# Patient Record
Sex: Female | Born: 1985 | Race: White | Hispanic: No | Marital: Married | Smoking: Never smoker
Health system: Southern US, Community
[De-identification: ages and names within clinical notes are randomized; demographics above are authoritative.]

## PROBLEM LIST (undated history)

## (undated) DIAGNOSIS — A6000 Herpesviral infection of urogenital system, unspecified: Secondary | ICD-10-CM

## (undated) DIAGNOSIS — E039 Hypothyroidism, unspecified: Secondary | ICD-10-CM

## (undated) HISTORY — PX: MYOMECTOMY: SHX85

---

## 2006-01-29 ENCOUNTER — Observation Stay: Payer: Self-pay | Admitting: Obstetrics and Gynecology

## 2006-01-31 ENCOUNTER — Observation Stay: Payer: Self-pay

## 2006-02-03 ENCOUNTER — Observation Stay: Payer: Self-pay | Admitting: Obstetrics and Gynecology

## 2006-05-04 ENCOUNTER — Inpatient Hospital Stay: Payer: Self-pay | Admitting: Obstetrics and Gynecology

## 2008-10-20 ENCOUNTER — Inpatient Hospital Stay: Payer: Self-pay | Admitting: Obstetrics and Gynecology

## 2008-10-25 ENCOUNTER — Emergency Department: Payer: Self-pay | Admitting: Internal Medicine

## 2012-03-05 ENCOUNTER — Ambulatory Visit: Payer: Self-pay

## 2012-03-20 ENCOUNTER — Ambulatory Visit: Payer: Self-pay

## 2012-11-26 DIAGNOSIS — D219 Benign neoplasm of connective and other soft tissue, unspecified: Secondary | ICD-10-CM | POA: Insufficient documentation

## 2014-05-19 DIAGNOSIS — N972 Female infertility of uterine origin: Secondary | ICD-10-CM | POA: Insufficient documentation

## 2014-05-19 DIAGNOSIS — N978 Female infertility of other origin: Secondary | ICD-10-CM | POA: Insufficient documentation

## 2014-07-15 DIAGNOSIS — E282 Polycystic ovarian syndrome: Secondary | ICD-10-CM | POA: Insufficient documentation

## 2015-01-29 DIAGNOSIS — N949 Unspecified condition associated with female genital organs and menstrual cycle: Secondary | ICD-10-CM | POA: Insufficient documentation

## 2015-02-17 DIAGNOSIS — R42 Dizziness and giddiness: Secondary | ICD-10-CM | POA: Insufficient documentation

## 2016-04-26 ENCOUNTER — Other Ambulatory Visit: Payer: Self-pay | Admitting: Obstetrics and Gynecology

## 2016-04-26 DIAGNOSIS — R1900 Intra-abdominal and pelvic swelling, mass and lump, unspecified site: Secondary | ICD-10-CM

## 2016-05-02 ENCOUNTER — Ambulatory Visit: Payer: Self-pay

## 2016-11-25 ENCOUNTER — Encounter: Payer: Self-pay | Admitting: Emergency Medicine

## 2016-11-25 ENCOUNTER — Emergency Department: Payer: BC Managed Care – PPO

## 2016-11-25 ENCOUNTER — Emergency Department
Admission: EM | Admit: 2016-11-25 | Discharge: 2016-11-26 | Disposition: A | Discharge status: Home / Self Care | Payer: BC Managed Care – PPO | Attending: Emergency Medicine | Admitting: Emergency Medicine

## 2016-11-25 DIAGNOSIS — X509XXA Other and unspecified overexertion or strenuous movements or postures, initial encounter: Secondary | ICD-10-CM | POA: Diagnosis not present

## 2016-11-25 DIAGNOSIS — Y9364 Activity, baseball: Secondary | ICD-10-CM | POA: Insufficient documentation

## 2016-11-25 DIAGNOSIS — Y92320 Baseball field as the place of occurrence of the external cause: Secondary | ICD-10-CM | POA: Diagnosis not present

## 2016-11-25 DIAGNOSIS — S8992XA Unspecified injury of left lower leg, initial encounter: Secondary | ICD-10-CM | POA: Diagnosis present

## 2016-11-25 DIAGNOSIS — S82102A Unspecified fracture of upper end of left tibia, initial encounter for closed fracture: Secondary | ICD-10-CM | POA: Diagnosis not present

## 2016-11-25 DIAGNOSIS — Y998 Other external cause status: Secondary | ICD-10-CM | POA: Diagnosis not present

## 2016-11-25 MED ORDER — FENTANYL CITRATE (PF) 100 MCG/2ML IJ SOLN
50.0000 ug | Freq: Once | INTRAMUSCULAR | Status: AC
  Administered 2016-11-25: 50 ug via INTRAVENOUS
  Filled 2016-11-25: qty 2

## 2016-11-25 NOTE — ED Triage Notes (Signed)
Patient brought in by ems. Patient was playing softball and when she stepped on first base she hyperextended her left knee. Patient with pain and swelling to left knee.

## 2016-11-25 NOTE — ED Notes (Signed)
Pt with pain to anterior upper tibial area, left sided.. Cms intact to all toes. Pt states she does have "a little tingling" to left great toe. No obvious deformity noted.

## 2016-11-25 NOTE — ED Notes (Signed)
Pt states "i feel faint". Head of bed lowered to flat. Pt reports improvement in presyncopal symptoms.

## 2016-11-26 MED ORDER — OXYCODONE-ACETAMINOPHEN 5-325 MG PO TABS
2.0000 | ORAL_TABLET | Freq: Once | ORAL | Status: AC
  Administered 2016-11-26: 2 via ORAL
  Filled 2016-11-26: qty 2

## 2016-11-26 MED ORDER — ONDANSETRON 4 MG PO TBDP: 4.0000 mg | ORAL_TABLET | Freq: Three times a day (TID) | ORAL | 0 refills | Status: DC | PRN

## 2016-11-26 MED ORDER — OXYCODONE-ACETAMINOPHEN 5-325 MG PO TABS: 1.0000 | ORAL_TABLET | Freq: Four times a day (QID) | ORAL | 0 refills | Status: DC | PRN

## 2016-11-26 NOTE — ED Provider Notes (Signed)
[facility] Emergency Department Provider Note  Time seen: 12:06 AM  I have reviewed the triage vital signs and the nursing notes.   HISTORY  Chief Complaint Knee Pain    HPI Andrea Brewer is a 31 y.o. female with no past medical history who presents to the emergency department with left knee pain. According to the patient she was playing softball, she stepped on first base and felt pain in the leg as if her knee and hyperextended. She immediately fell to the ground tumbling. States she has not been able to stand on the knee since due to pain. Denies any other injuries. Describes her pain as mild currently after pain medication however was moderate to severe earlier.  History reviewed. No pertinent past medical history.  There are no active problems to display for this patient.   Past Surgical History:  Procedure Laterality Date  . MYOMECTOMY      Prior to Admission medications   Not on File    No Known Allergies  No family history on file.  Social History Social History  Substance Use Topics  . Smoking status: Never Smoker  . Smokeless tobacco: Never Used  . Alcohol use No    Review of Systems Constitutional: Negative for Loss of consciousness. Cardiovascular: Negative for chest pain. Respiratory: Negative for shortness of breath. Gastrointestinal: Negative for abdominal pain Musculoskeletal: Moderate left knee pain Skin: Negative for rash. Neurological: Negative for headache All other ROS negative  ____________________________________________   PHYSICAL EXAM:  VITAL SIGNS: ED Triage Vitals  Enc Vitals Group     BP 11/25/16 2154 (!) 117/101     Pulse Rate 11/25/16 2154 (!) 107     Resp 11/25/16 2154 18     Temp 11/25/16 2154 98.4 F (36.9 C)     Temp Source 11/25/16 2154 Oral     SpO2 11/25/16 2154 100 %     Weight 11/25/16 2154 205 lb (93 kg)     Height 11/25/16 2154 5\' 5"  (1.651 m)     Head Circumference --       Peak Flow --      Pain Score 11/25/16 2153 7     Pain Loc --      Pain Edu? --      Excl. in [place]? --     Constitutional: Alert and oriented. Well appearing and in no distress. Eyes: Normal exam ENT   Head: Normocephalic and atraumatic.   Mouth/Throat: Mucous membranes are moist. Cardiovascular: Normal rate, regular rhythm. No murmur Respiratory: Normal respiratory effort without tachypnea nor retractions. Breath sounds are clear Gastrointestinal: Soft and nontender. No distention.  Musculoskeletal: Good range of motion in all extremities. Patient has moderate tenderness to palpation of the left knee/upper proximal tibia no obvious swelling or effusion. Neurovascularly intact distally. The remainder of the tibia and fibula are nontender. Neurologic:  Normal speech and language. No gross focal neurologic deficits  Skin:  Skin is warm, dry and intact.  Psychiatric: Mood and affect are normal.   ____________________________________________     RADIOLOGY  X-ray shows possible fracture/avulsion of the medial tibia.  ____________________________________________   INITIAL IMPRESSION / ASSESSMENT AND PLAN / ED COURSE  Pertinent labs & imaging results that were available during my care of the patient were reviewed by me and considered in my medical decision making (see chart for details).  Patient presents with left knee pain after traumatic injury playing softball. Patient has medial proximal tibia tenderness otherwise nontender leg.  No distal tibia or fibula tenderness. On x-ray the patient appears to have a fracture versus small avulsion fracture of the medial left proximal tibia. I discussed this with Dr. Mack Guise who recommends a knee immobilizer and office follow-up. Patient states her pain is minimal after pain medication. We'll discharge with pain medicine, knee immobilizer crutches and nonweightbearing status. Patient will follow up with orthopedics next  week.  ____________________________________________   FINAL CLINICAL IMPRESSION(S) / ED DIAGNOSES  Proximal tibia fracture    Harvest Dark, MD 11/26/16 0010

## 2016-11-26 NOTE — Discharge Instructions (Signed)
Please call the number provided for orthopedics to arrange a follow-up appointment this coming week for recheck/reevaluation. Please wear your knee immobilizer, use crutches. You're not to bear any weight on the left leg, until orthopedics can evaluate. Return to the emergency department for any significant worsening of pain.

## 2016-11-26 NOTE — ED Notes (Signed)
Pt. Going home with husband. 

## 2016-11-28 ENCOUNTER — Other Ambulatory Visit: Payer: Self-pay | Admitting: Orthopedic Surgery

## 2016-11-28 DIAGNOSIS — S83259A Bucket-handle tear of lateral meniscus, current injury, unspecified knee, initial encounter: Secondary | ICD-10-CM

## 2016-11-29 ENCOUNTER — Ambulatory Visit
Admission: RE | Admit: 2016-11-29 | Discharge: 2016-11-29 | Disposition: A | Discharge status: Home / Self Care | Payer: BC Managed Care – PPO | Source: Ambulatory Visit | Attending: Orthopedic Surgery | Admitting: Orthopedic Surgery

## 2016-11-29 DIAGNOSIS — S8012XA Contusion of left lower leg, initial encounter: Secondary | ICD-10-CM | POA: Insufficient documentation

## 2016-11-29 DIAGNOSIS — S83212A Bucket-handle tear of medial meniscus, current injury, left knee, initial encounter: Secondary | ICD-10-CM | POA: Diagnosis not present

## 2016-11-29 DIAGNOSIS — X58XXXA Exposure to other specified factors, initial encounter: Secondary | ICD-10-CM | POA: Insufficient documentation

## 2016-11-29 DIAGNOSIS — S82142A Displaced bicondylar fracture of left tibia, initial encounter for closed fracture: Secondary | ICD-10-CM | POA: Diagnosis not present

## 2016-11-29 DIAGNOSIS — S83259A Bucket-handle tear of lateral meniscus, current injury, unspecified knee, initial encounter: Secondary | ICD-10-CM

## 2016-11-29 DIAGNOSIS — S82202A Unspecified fracture of shaft of left tibia, initial encounter for closed fracture: Secondary | ICD-10-CM | POA: Diagnosis not present

## 2017-01-12 ENCOUNTER — Ambulatory Visit: Payer: BC Managed Care – PPO | Attending: Orthopedic Surgery | Admitting: Physical Therapy

## 2017-01-12 DIAGNOSIS — R262 Difficulty in walking, not elsewhere classified: Secondary | ICD-10-CM | POA: Diagnosis present

## 2017-01-12 DIAGNOSIS — M25562 Pain in left knee: Secondary | ICD-10-CM | POA: Diagnosis present

## 2017-01-16 NOTE — Therapy (Signed)
[place] [facility] [address], [place], [address] Phone: [phone]   Fax:  [phone]  Physical Therapy Evaluation  Patient Details  Name: Andrea Brewer MRN: [id number] Date of Birth: 05-Sep-1985 No Data Recorded  Encounter Date: 01/12/2017      PT End of Session - 01/16/17 0754    Visit Number 1   Number of Visits 13   Date for PT Re-Evaluation 03/13/17   PT Start Time 7681   PT Stop Time 1830   PT Time Calculation (min) 55 min   Activity Tolerance Patient tolerated treatment well   Behavior During Therapy [facility] for tasks assessed/performed      No past medical history on file.  Past Surgical History:  Procedure Laterality Date  . MYOMECTOMY      There were no vitals filed for this visit.       Subjective Assessment - 01/16/17 0755    Subjective Patient reports she was running to 1st base and hyper-extended her L knee and sustained a non-displaced tibial plateau fracture. She was non weight bearing for 4 weeks, repeat X-ray showed bone healing and she was allowed progressive WBing with crutches. She still gets pain primarily with knee flexion, can also get with knee extension. She reports she has had her L LE buckle a few times walking.    Limitations Walking;Lifting;Standing;House hold activities   Diagnostic tests X-ray, follow up X-ray, CT. Indicative of medial plateau tibial fx (non-displaced) which has healed on follow up x-ray.    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    Currently in Pain? Other (Comment)  At rest very little pain, with demand on quadriceps like SLR or excessive ambulation she gets pain in the patellar tendon area      R knee ROM -8 - 128 degrees  L Knee ROM  -3 - 103 degrees   Quad lag noted on L SLR   Gait assessment with crutches - WNL appropriate heel strike on RLE (mild knee flexion on LLE and more plantar surface of L heel)  With  Crutch in RUE only, noted to have lateral trunk flexion to R side, Trendelenburg on L side.   Circumfrence measurements at joint line  R-40.0 cm L 43.5 cm   TherEx Provided  Quad sets with towel roll underneath her ankle emphasizing full extension ROM x 10 for 2 sets  Mini squats with HHA x 8 (going through roughly 30 degrees of knee flexion --no increased pain reported)  Knee flexion with towel for overpressure throughout the day in supine to increase L knee flexion ROM  Standing hip abduction with yellow/red band SLRs with knee immobilizer on until quad set is not observed x 10           Objective measurements completed on examination: See above findings.                  PT Education - 01/16/17 0755    Education provided Yes   Education Details Provided HEP and goal of increasing extension ROM and quadriceps strength to improve tolerance for ADLs.    Person(s) Educated Patient   Methods Explanation;Demonstration;Handout;Verbal cues   Comprehension Verbalized understanding;Returned demonstration;Verbal cues required             PT Long Term Goals - 01/16/17 0848      PT LONG TERM GOAL #1   Title Patient will demonstrate at least  120 degrees of knee flexion ROM to return to completing ADLs.   Baseline 103 degrees   Time 8   Period Weeks   Status New   Target Date 03/13/17     PT LONG TERM GOAL #2   Title Patient will demonstrate normalized gait pattern with no increase in pain to return to completing ADLs.    Baseline Painful gait with ADs.    Time 8   Period Weeks   Status New   Target Date 03/13/17     PT LONG TERM GOAL #3   Title Patient will jog for at least 5 minutes with no increase in pain to return to recreational activities.    Time 8   Period Weeks   Status New   Target Date 03/13/17     PT LONG TERM GOAL #4   Title Patient will perform full squat to lift up items at home with no increase in pain to return to performing ADLs.     Time 8   Period Weeks   Status New   Target Date 03/13/17                Plan - 01/16/17 0757    Clinical Impression Statement Patient presents roughly 2 months s/p non-displaced tibial plateau fx, imaging thus far has shown appropriate healing and she is allowed to WB as tolerated. She demonstrates quadriceps lag in SLR, lack of knee extension ROM and significant loss of knee flexion still with moderate edema noted around the joint. Additionally, glute weakness is demonstrated by increasing lateral trunk tilt and/or Trendelenburg with gait with 1 AD. She was provided with HEP to address the listed deficits and would benefit from skilled PT services to address the listed findings.    Clinical Presentation Stable   Clinical Decision Making Moderate   Rehab Potential Good   PT Frequency 2x / week   PT Duration 6 weeks   PT Treatment/Interventions Aquatic Therapy;Cryotherapy;Electrical Stimulation;Therapeutic exercise;Therapeutic activities;Moist Heat;Iontophoresis 4mg /ml Dexamethasone;Gait training;Stair training;Neuromuscular re-education;Manual techniques;Taping;Dry needling;Patient/family education   PT Next Visit Plan Check Quad lag, knee ROM, gait mechanics and wean from ADs as tolerated.    PT Home Exercise Plan SLR with knee immobilizer in place, mini -squats, quad sets with towel roll underneath her foot, knee flexion with strap.    Consulted and Agree with Plan of Care Patient      Patient will benefit from skilled therapeutic intervention in order to improve the following deficits and impairments:  Abnormal gait, Decreased balance, Decreased activity tolerance, Difficulty walking, Decreased strength, Decreased range of motion, Decreased endurance, Pain, Decreased knowledge of use of DME, Increased edema  Visit Diagnosis: Acute pain of left knee - Plan: PT plan of care cert/re-cert  Difficulty in walking, not elsewhere classified - Plan: PT plan of care  cert/re-cert     Problem List There are no active problems to display for this patient.  Royce Macadamia PT, DPT, CSCS    01/16/2017, 8:51 AM  [place] [facility] [address] [place], [address] Phone: [phone]   Fax:  [phone]  Name: CHLOEANNE POTEET MRN: [id number] Date of Birth: 23-Feb-1986

## 2017-01-16 NOTE — Patient Instructions (Signed)
R knee ROM -8 - 128 degrees  L Knee ROM  -3 - 103 degrees   Quad lag noted on L SLR   Gait assessment with crutches - WNL appropriate heel strike on RLE (mild knee flexion on LLE and more plantar surface of L heel)  With Crutch in RUE only, noted to have lateral trunk flexion to R side, Trendelenburg on L side.   Circumfrence measurements at joint line  R-40.0 cm L 43.5 cm   TherEx Provided

## 2017-01-17 ENCOUNTER — Ambulatory Visit: Payer: BC Managed Care – PPO | Admitting: Physical Therapy

## 2017-01-19 ENCOUNTER — Ambulatory Visit: Payer: BC Managed Care – PPO | Attending: Orthopedic Surgery | Admitting: Physical Therapy

## 2017-01-19 DIAGNOSIS — R262 Difficulty in walking, not elsewhere classified: Secondary | ICD-10-CM | POA: Diagnosis present

## 2017-01-19 DIAGNOSIS — M25562 Pain in left knee: Secondary | ICD-10-CM | POA: Insufficient documentation

## 2017-01-19 NOTE — Patient Instructions (Addendum)
L knee ROM -6 degrees to 120  Calf stretching   Hip abductions in standing  Squats with BW and 5# DB

## 2017-01-19 NOTE — Therapy (Signed)
[place] [facility] [address] [place], [address] Phone: [phone]   Fax:  [phone]  Physical Therapy Treatment  Patient Details  Name: Andrea Brewer MRN: [id number] Date of Birth: 19-Aug-1985 No Data Recorded  Encounter Date: 01/19/2017      PT End of Session - 01/19/17 1834    Visit Number 2   Number of Visits 13   Date for PT Re-Evaluation 03/13/17   PT Start Time 0962   PT Stop Time 1826   PT Time Calculation (min) 40 min   Activity Tolerance Patient tolerated treatment well   Behavior During Therapy [facility] for tasks assessed/performed      No past medical history on file.  Past Surgical History:  Procedure Laterality Date  . MYOMECTOMY      There were no vitals filed for this visit.      Subjective Assessment - 01/19/17 1750    Subjective Patient reports her gait has improved tremendously and she really hasn't had pain since last appointment, aside from small bits here and there. Reports she is able to ambulate now without crutches for short distances.    Limitations Walking;Lifting;Standing;House hold activities   Diagnostic tests X-ray, follow up X-ray, CT. Indicative of medial plateau tibial fx (non-displaced) which has healed on follow up x-ray.    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    Currently in Pain? No/denies      L knee ROM -6 degrees to 120  Calf stretching on both wall and on steps x 3 bouts x 30" per bout  Gait assessment- WNL with no lateral trunk displacement during ambulation with 1 crutch, without crutches, continues to show trendelenburg and mild collapse into flexion especially with a few steps (fatigued)   Hip abductions in standing with red and green t-band x 10 for 3 sets bilaterally   Squats with BW and 5# DB x 10 per set for 2 sets (challenging, noted to shift weight onto RLE)  TKEs with red t-band with cuing for getting to  full knee extension x 15 for 3 sets.   Step ups x10 for 2 sets with HHA (minimal) to step with 1 riser (good quad excursion and output noted)                              PT Education - 01/19/17 1832    Education provided Yes   Education Details Updated HEP to address existing deficits. May want to consider orthotics/heel lifts especially on L side.    Person(s) Educated Patient   Methods Explanation;Demonstration;Handout   Comprehension Verbalized understanding;Returned demonstration             PT Long Term Goals - 01/16/17 0848      PT LONG TERM GOAL #1   Title Patient will demonstrate at least 120 degrees of knee flexion ROM to return to completing ADLs.   Baseline 103 degrees   Time 8   Period Weeks   Status New   Target Date 03/13/17     PT LONG TERM GOAL #2   Title Patient will demonstrate normalized gait pattern with no increase in pain to return to completing ADLs.    Baseline Painful gait with ADs.    Time 8   Period Weeks   Status New   Target Date 03/13/17     PT LONG TERM  GOAL #3   Title Patient will jog for at least 5 minutes with no increase in pain to return to recreational activities.    Time 8   Period Weeks   Status New   Target Date 03/13/17     PT LONG TERM GOAL #4   Title Patient will perform full squat to lift up items at home with no increase in pain to return to performing ADLs.    Time 8   Period Weeks   Status New   Target Date 03/13/17               Plan - 01/19/17 1835    Clinical Impression Statement Patient demonstrates significant improvement in gait mechanics, ROM, and strength in her quadricep. She has also had significant reduction in pain levels as well. She demonstrates roughly normal gait mechanics with 1 crutch and has progressed quite well towards all mobility goals   Clinical Presentation Stable   Clinical Decision Making Moderate   Rehab Potential Good   PT Frequency 2x / week   PT  Duration 6 weeks   PT Treatment/Interventions Aquatic Therapy;Cryotherapy;Electrical Stimulation;Therapeutic exercise;Therapeutic activities;Moist Heat;Iontophoresis 4mg /ml Dexamethasone;Gait training;Stair training;Neuromuscular re-education;Manual techniques;Taping;Dry needling;Patient/family education   PT Next Visit Plan Check Quad lag, knee ROM, gait mechanics and wean from ADs as tolerated.    PT Home Exercise Plan SLR with knee immobilizer in place, mini -squats, quad sets with towel roll underneath her foot, knee flexion with strap.    Consulted and Agree with Plan of Care Patient      Patient will benefit from skilled therapeutic intervention in order to improve the following deficits and impairments:  Abnormal gait, Decreased balance, Decreased activity tolerance, Difficulty walking, Decreased strength, Decreased range of motion, Decreased endurance, Pain, Decreased knowledge of use of DME, Increased edema  Visit Diagnosis: Acute pain of left knee  Difficulty in walking, not elsewhere classified     Problem List There are no active problems to display for this patient.  Royce Macadamia PT, DPT, CSCS     01/19/2017, 6:43 PM   [place] [facility] [address] [place], [address] Phone: [phone]   Fax:  [phone]  Name: ANTHA NIDAY MRN: [id number] Date of Birth: [birth year]

## 2017-01-23 ENCOUNTER — Ambulatory Visit: Payer: BC Managed Care – PPO | Admitting: Physical Therapy

## 2017-01-25 ENCOUNTER — Ambulatory Visit: Payer: BC Managed Care – PPO | Admitting: Physical Therapy

## 2017-01-25 DIAGNOSIS — M25562 Pain in left knee: Secondary | ICD-10-CM

## 2017-01-25 DIAGNOSIS — R262 Difficulty in walking, not elsewhere classified: Secondary | ICD-10-CM

## 2017-01-25 NOTE — Patient Instructions (Signed)
Hip hikes  Gait observations   Standing hip abductions with blue t-band  Step ups   Squat assessment and onto blue side of BOSU up   Lunge

## 2017-01-25 NOTE — Therapy (Signed)
[place] [facility] [address], [place], [address] Phone: [phone]   Fax:  [phone]  Physical Therapy Treatment  Patient Details  Name: Andrea Brewer MRN: [id number] Date of Birth: 12/23/1985 No Data Recorded  Encounter Date: 01/25/2017      PT End of Session - 01/25/17 1243    Visit Number 3   Number of Visits 13   Date for PT Re-Evaluation 03/13/17   PT Start Time 8676   PT Stop Time 1115   PT Time Calculation (min) 40 min   Activity Tolerance Patient tolerated treatment well   Behavior During Therapy [facility] for tasks assessed/performed      No past medical history on file.  Past Surgical History:  Procedure Laterality Date  . MYOMECTOMY      There were no vitals filed for this visit.      Subjective Assessment - 01/25/17 1242    Subjective Patient reports her knee really isn't bothering her anymore. She is still having some difficulty descending steps secondary to LLE quad weakness, but otherwise feels she is doing quite well.    Limitations Walking;Lifting;Standing;House hold activities   Diagnostic tests X-ray, follow up X-ray, CT. Indicative of medial plateau tibial fx (non-displaced) which has healed on follow up x-ray.    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    Currently in Pain? No/denies      Hip hikes (cuing for going through pelvic drop to pelvic elevation on step x 10 per side for 2 sets to provide proper cuing).   Gait observations -- she has mild pelvic drop/Trendelenburg with prolonged ambulation, though this is minimal relative to previous session. Otherwise full extension noted.  Standing hip abductions with blue t-band x 10 for 3 sets  Step ups with 1 plastic riser x 10 per side (appropriately challenging on her quadricep   Squat assessment and onto blue side of BOSU up (noted to be shifting weight onto her R side, thus used BOSU to provide  bio-feedback for shifting weight back onto L side to maintain more symmetrical WBing.   Lunge x 10 per side (cuing to go through knee flexion by not translating tibia anteriorly)  Bridge walk outs x 6 repetitions (cuing for level pelvis and going through full ROM until she is unable to maintain pelvis above table)                           PT Education - 01/25/17 1243    Education provided Yes   Education Details Patient will likely require 1 more follow up visit, provided HEP to address residual deficits.    Person(s) Educated Patient   Methods Explanation;Demonstration;Handout   Comprehension Verbalized understanding;Returned demonstration             PT Long Term Goals - 01/16/17 0848      PT LONG TERM GOAL #1   Title Patient will demonstrate at least 120 degrees of knee flexion ROM to return to completing ADLs.   Baseline 103 degrees   Time 8   Period Weeks   Status New   Target Date 03/13/17     PT LONG TERM GOAL #2   Title Patient will demonstrate normalized gait pattern with no increase in pain to return to completing ADLs.    Baseline Painful gait with ADs.    Time 8   Period Weeks  Status New   Target Date 03/13/17     PT LONG TERM GOAL #3   Title Patient will jog for at least 5 minutes with no increase in pain to return to recreational activities.    Time 8   Period Weeks   Status New   Target Date 03/13/17     PT LONG TERM GOAL #4   Title Patient will perform full squat to lift up items at home with no increase in pain to return to performing ADLs.    Time 8   Period Weeks   Status New   Target Date 03/13/17               Plan - 01/25/17 1246    Clinical Impression Statement Patient's gait is largely normalized, she has some residual strength loss in posterior hip musculature, quadriceps, and hamstrings which were all addressed with HEP progressions in this session.    Clinical Presentation Stable   Clinical Decision  Making Moderate   Rehab Potential Good   PT Frequency 2x / week   PT Duration 6 weeks   PT Treatment/Interventions Aquatic Therapy;Cryotherapy;Electrical Stimulation;Therapeutic exercise;Therapeutic activities;Moist Heat;Iontophoresis 4mg /ml Dexamethasone;Gait training;Stair training;Neuromuscular re-education;Manual techniques;Taping;Dry needling;Patient/family education   PT Next Visit Plan Check Quad lag, knee ROM, gait mechanics and wean from ADs as tolerated.    PT Home Exercise Plan SLR with knee immobilizer in place, mini -squats, quad sets with towel roll underneath her foot, knee flexion with strap.    Consulted and Agree with Plan of Care Patient      Patient will benefit from skilled therapeutic intervention in order to improve the following deficits and impairments:  Abnormal gait, Decreased balance, Decreased activity tolerance, Difficulty walking, Decreased strength, Decreased range of motion, Decreased endurance, Pain, Decreased knowledge of use of DME, Increased edema  Visit Diagnosis: Acute pain of left knee  Difficulty in walking, not elsewhere classified     Problem List There are no active problems to display for this patient.  Royce Macadamia PT, DPT, CSCS    01/25/2017, 12:48 PM  [place] [facility] [address] [place], [address] Phone: [phone]   Fax:  [phone]  Name: Andrea Brewer MRN: [id number] Date of Birth: 1986-02-17

## 2017-01-30 ENCOUNTER — Encounter: Payer: BC Managed Care – PPO | Admitting: Physical Therapy

## 2017-02-02 ENCOUNTER — Encounter: Payer: BC Managed Care – PPO | Admitting: Physical Therapy

## 2017-02-06 ENCOUNTER — Encounter: Payer: BC Managed Care – PPO | Admitting: Physical Therapy

## 2017-02-09 ENCOUNTER — Encounter: Payer: BC Managed Care – PPO | Admitting: Physical Therapy

## 2017-02-13 ENCOUNTER — Ambulatory Visit (INDEPENDENT_AMBULATORY_CARE_PROVIDER_SITE_OTHER): Payer: BC Managed Care – PPO | Admitting: Podiatry

## 2017-02-13 ENCOUNTER — Ambulatory Visit (INDEPENDENT_AMBULATORY_CARE_PROVIDER_SITE_OTHER): Payer: BC Managed Care – PPO

## 2017-02-13 ENCOUNTER — Encounter: Payer: Self-pay | Admitting: Podiatry

## 2017-02-13 VITALS — BP 107/68 | HR 81 | Resp 16

## 2017-02-13 DIAGNOSIS — R1900 Intra-abdominal and pelvic swelling, mass and lump, unspecified site: Secondary | ICD-10-CM | POA: Insufficient documentation

## 2017-02-13 DIAGNOSIS — E669 Obesity, unspecified: Secondary | ICD-10-CM | POA: Insufficient documentation

## 2017-02-13 DIAGNOSIS — M722 Plantar fascial fibromatosis: Secondary | ICD-10-CM

## 2017-02-13 MED ORDER — MELOXICAM 15 MG PO TABS: 15.0000 mg | ORAL_TABLET | Freq: Every day | ORAL | 3 refills | Status: DC

## 2017-02-13 MED ORDER — METHYLPREDNISOLONE 4 MG PO TBPK: ORAL_TABLET | ORAL | 0 refills | Status: DC

## 2017-02-13 NOTE — Progress Notes (Signed)
   Subjective:    Patient ID: Andrea Brewer, female    DOB: [birth year], 31 y.o.   MRN: [id number]  HPI: She presents today states that she has plantar fasciitis plantar heel pain left greater than right. She states been aching for several months. Sesamoids are particularly bad. States that she broke her leg this past year on the last day of school and when she started walking again and her foot started to hurt. She states that she's had plantar fasciitis in the past.    Review of Systems  All other systems reviewed and are negative.      Objective:   Physical Exam: Vital signs are stable she is alert and oriented 3. Pulses are palpable. Neurologic sensorium is intact. Deep tendon reflexes are intact. Muscle strength was 5 over 5 dorsiflexion plantar flexors and inverters everters all intrinsic musculature is intact. Orthopedic evaluation strength all joints distal to the ankle range of motion without crepitation. Cutaneous evaluation shows supple well-hydrated Q is no erythema edema cellulitis drainage or odor. She has pain on palpation medial calcaneal tubercle of the left heel greater than the right. Radiographs taken today do demonstrate a soft tissue increase in density at the plantar fascia insertion sites bilateral heels however she does have plantar distally oriented calcaneal heel spurs.        Assessment & Plan:  Assessment: Plantar fasciitis bilateral left greater than right.  Plan: Injected the bilateral heels today with Kenalog and local anesthetic placed her plantar fascia braces bilateral night splint. Start her on a Medrol Dosepak to be followed by meloxicam. Discussed appropriate shoe gear stretching exercises ice therapy and shoe gear modifications. Follow-up with her in 1 month's

## 2017-02-13 NOTE — Patient Instructions (Signed)

## 2017-02-21 ENCOUNTER — Ambulatory Visit: Payer: BC Managed Care – PPO | Admitting: Physical Therapy

## 2017-12-20 ENCOUNTER — Ambulatory Visit: Payer: BC Managed Care – PPO | Admitting: Podiatry

## 2017-12-20 ENCOUNTER — Encounter: Payer: Self-pay | Admitting: Podiatry

## 2017-12-20 ENCOUNTER — Encounter

## 2017-12-20 DIAGNOSIS — M722 Plantar fascial fibromatosis: Secondary | ICD-10-CM | POA: Diagnosis not present

## 2017-12-20 MED ORDER — MELOXICAM 15 MG PO TABS: 15.0000 mg | ORAL_TABLET | Freq: Every day | ORAL | 3 refills | Status: DC

## 2017-12-20 NOTE — Progress Notes (Signed)
She presents today for another flareup of plantar fasciitis x2 months.  She states her right foot is worse than her left and the pain is not as bad as it has been.  She states that she is been using her braces and her medications.  Objective: Vital signs are stable alert and oriented x3.  Pulses are palpable.  Severe pain on palpation medial calcaneal tubercle bilateral.  Neurologic sensorium is intact.  Deep tendon reflexes are intact.  Assessment: Plantar fasciitis bilateral.  Plan: I injected the area of the bilateral heels today after sterile skin prep 20 mg Kenalog 5 mg Marcaine to each heel.  Tolerated procedure well without complications.  Recommend she continue her plantar fascial braces and her night splint also started her back on her meloxicam.  We will call in a Medrol Dosepak if she deems it necessary.

## 2018-04-27 IMAGING — MR MR KNEE*L* W/O CM
6 series · 37 of 40 positions shown · non-contrast
Comparison: Plain films left knee 11/25/2016.

CLINICAL DATA: Left knee injury 11/25/2016 playing softball. Pain
and swelling. Initial encounter.

EXAM:
MRI OF THE LEFT KNEE WITHOUT CONTRAST
TECHNIQUE: Multiplanar, multisequence MR imaging of the knee was performed. No
intravenous contrast was administered.

[Series 3: PD fat-sat · axial · 3.0mm · 0.50mm/px · z∈[-90,+48]mm · 9 of 43 slices shown (1 of 4)]
[im 1/43]
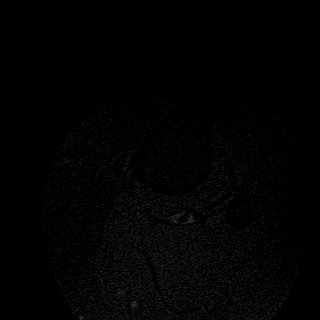
[im 6/43]
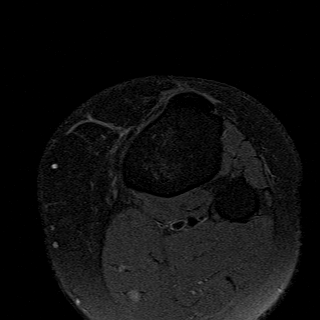
[im 11/43]
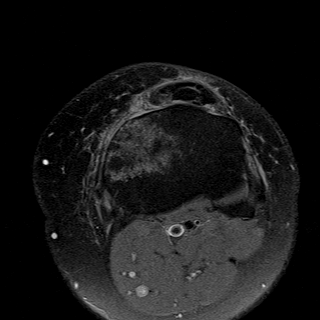
[im 16/43]
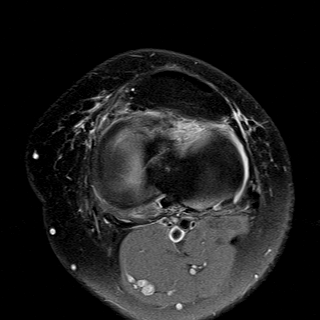
[im 22/43]
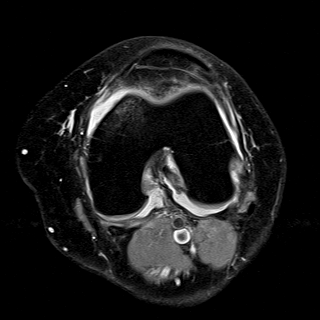
[im 27/43]
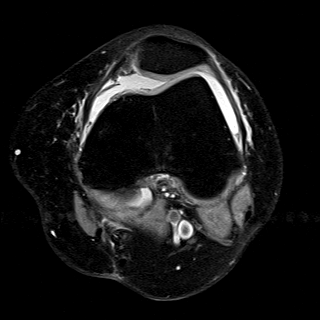
[im 32/43]
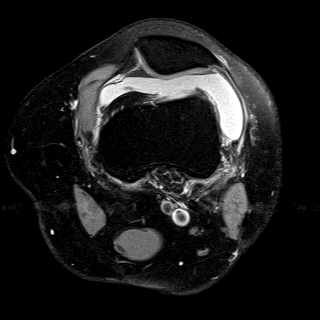
[im 37/43]
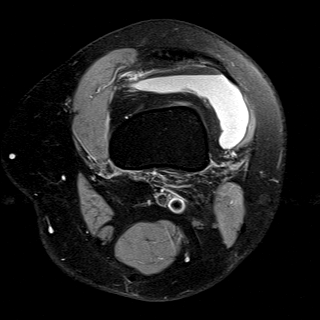
[im 43/43]
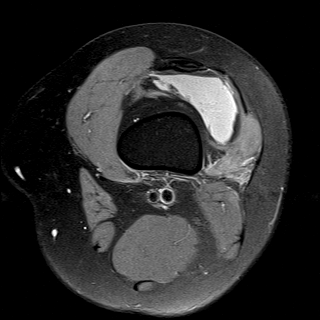

[Series 4: T1 · coronal · 3.0mm · 0.50mm/px · 4 of 35 slices shown]
[im 1/35]
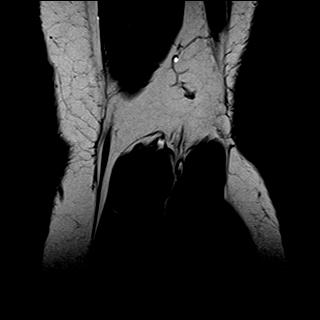
[im 6/35]
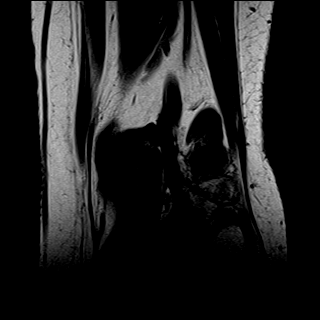
[im 12/35]
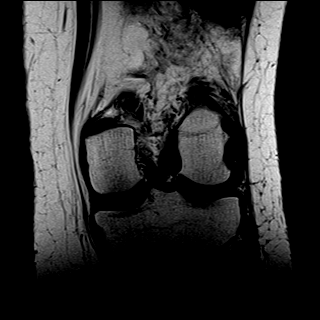
[im 18/35]
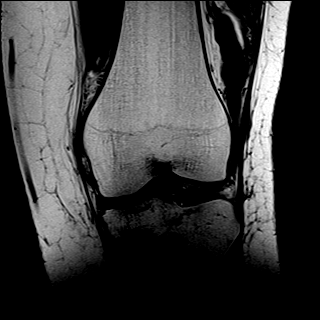

[Series 7: T2 fat-sat · coronal · 3.0mm · 0.31mm/px · 7 of 35 slices shown]
[im 1/35]
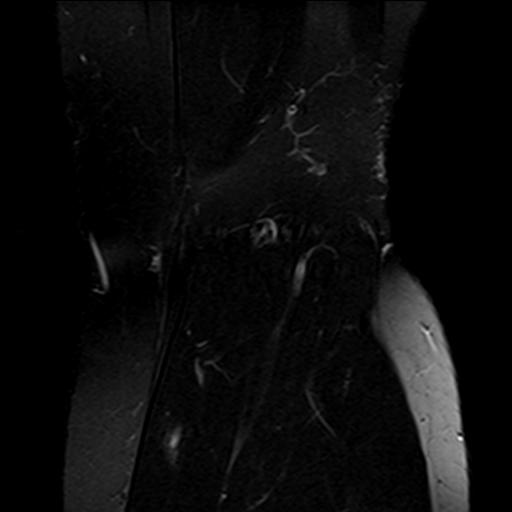
[im 6/35]
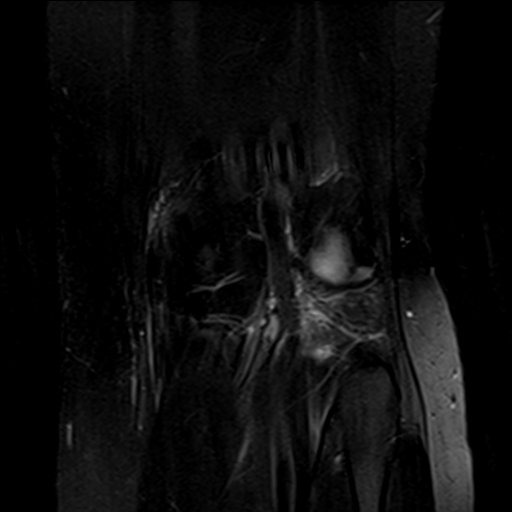
[im 12/35]
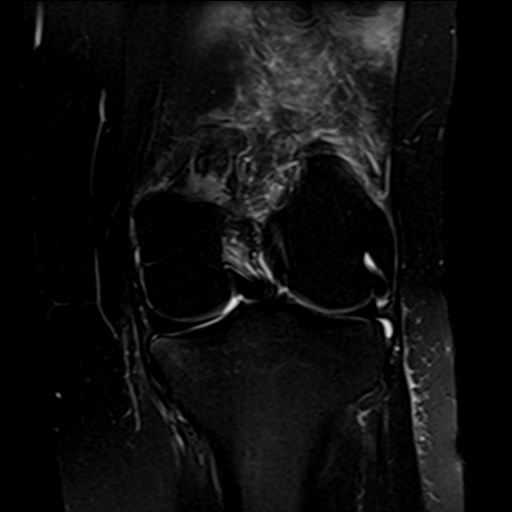
[im 18/35]
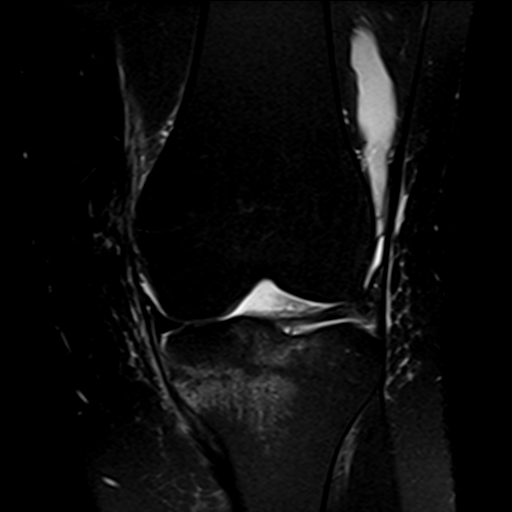
[im 23/35]
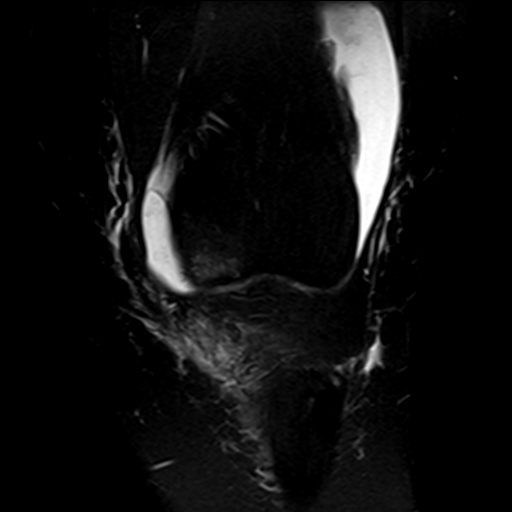
[im 29/35]
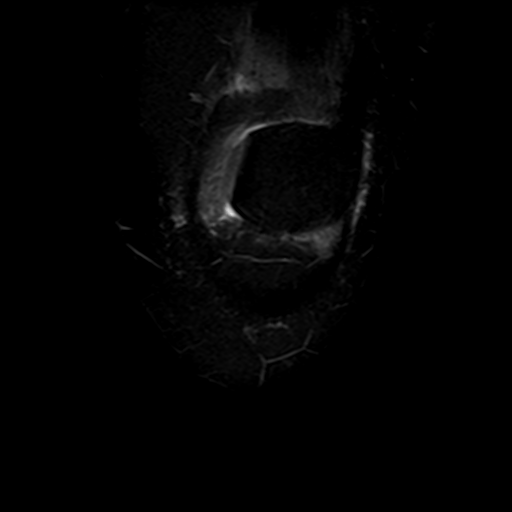
[im 35/35]
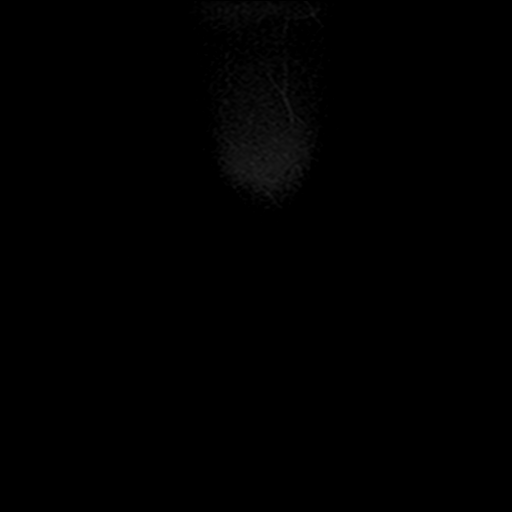

[Series 8: PD fat-sat · coronal · 3.0mm · 0.50mm/px · 7 of 35 slices shown (2 of 4)]
[im 1/35]
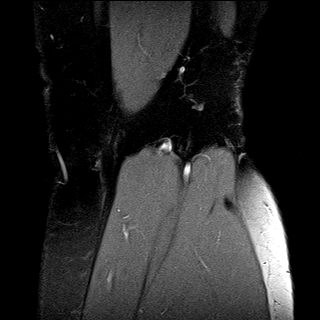
[im 6/35]
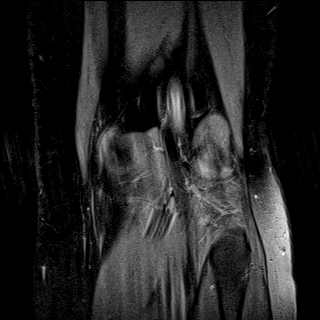
[im 12/35]
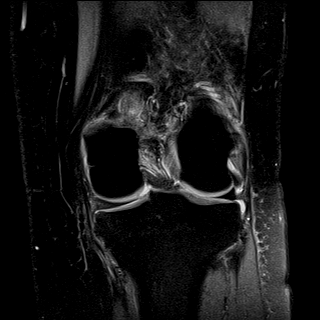
[im 18/35]
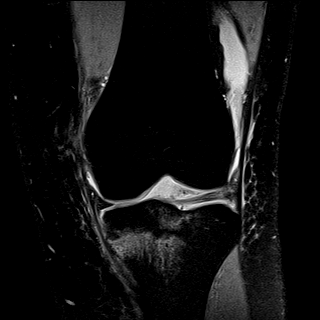
[im 23/35]
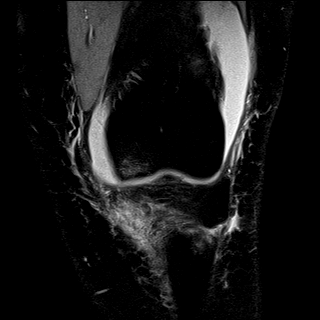
[im 29/35]
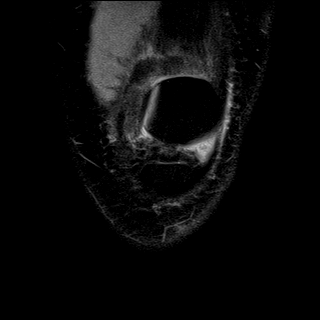
[im 35/35]
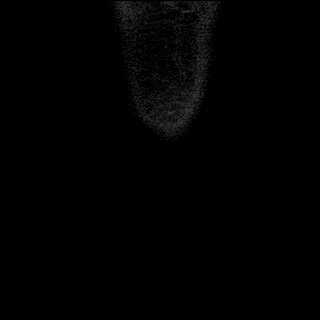

[Series 9: PD fat-sat · sagittal · 3.0mm · 0.50mm/px · 7 of 33 slices shown (3 of 4)]
[im 1/33]
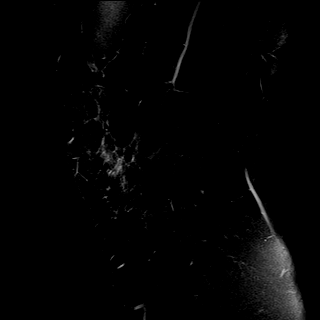
[im 6/33]
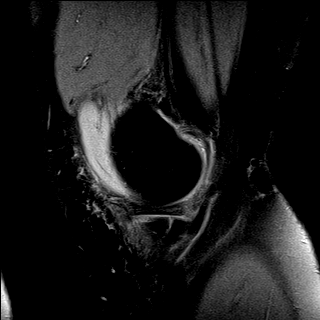
[im 11/33]
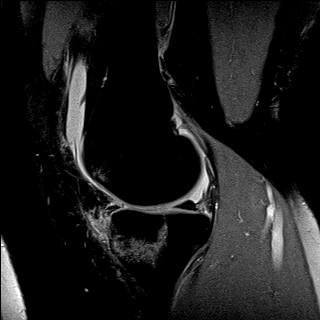
[im 17/33]
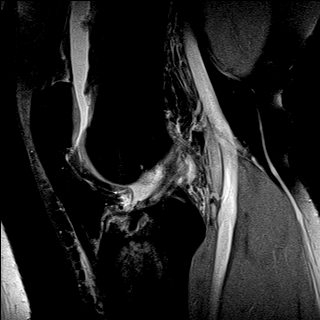
[im 22/33]
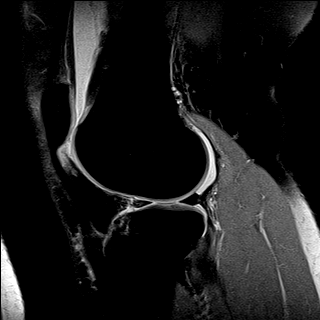
[im 27/33]
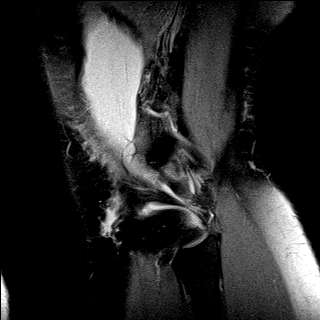
[im 33/33]
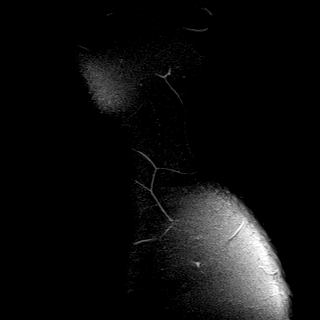

[Series 10: PD fat-sat · oblique · 2.0mm · 0.62mm/px · 3 of 17 slices shown (4 of 4)]
[im 1/17]
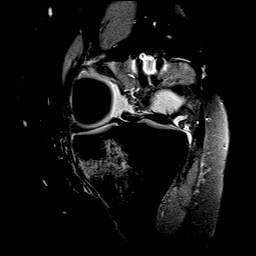
[im 9/17]
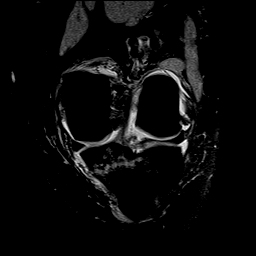
[im 17/17]
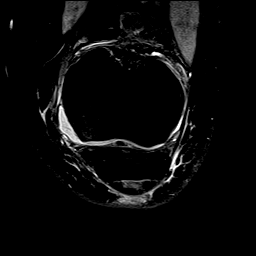

[37 of 40 positions shown; findings below may reference images not displayed]

FINDINGS: MENISCI

Medial meniscus:  Intact.

Lateral meniscus:  Intact.

LIGAMENTS

Cruciates:  Intact.

Collaterals:  Intact.

CARTILAGE

Patellofemoral: There is some fibrillation and irregularity of the
surface of hyaline cartilage along the lateral facet. Mild lateral
subluxation of the patella is noted.

Medial:  Normal.

Lateral:  Normal.

Joint:  Moderate effusion.

Popliteal Fossa:  No Baker's cyst.

Extensor Mechanism:  Intact.

Bones: Marrow edema in the proximal tibia is worse on the medial
side. Edema extends from the medial metaphysis to subchondral bone
in the anterior aspect of the lateral tibial plateau where there is
a nondisplaced fracture. There is no displaced fracture or
depression of the tibial plateaus. Mild marrow edema in the medial
femoral condyle consistent with contusion is also identified.

Other: None.
IMPRESSION: Bone contusion and nondisplaced fracture of the tibia as described
above. No disruption of the tibial plateau is identified. Small bone
contusion in the anterior aspect of the medial femoral condyle is
also seen.

Negative for meniscal or ligament tear.

## 2019-03-21 NOTE — H&P (Signed)
OB History & Physical   History of Present Illness:  Chief Complaint:   HPI:  Andrea Brewer is a 33 y.o. No obstetric history on file. [facility] 11/2 /2020 Pregnancy Issues:1. History of genital HSV  Will need Valtrex suppression at 36w 2. Prepregnancy BMI 38  CMP, P/C ratio, and A1c ordered 09/27/2018 mah  Advised TWG 11-20 pounds  12/11/18: early GTT 95, A1C 5.2 3. Thyroid treatment  Started on 29mcg/day Synthroid by REI  [x]  TSH 1st tri: 0.580 on 09/27/2018  Valu.Nieves ] TSH 2nd tri: 1.622 11/29/18  [ ]  TSH 3rd tri 4. History of myomectomy x 2 d/t Fibroids  Plan for q4wk growth scans, with her measurement 10 weeks ahead at 28w visit  Surgery 01/2013, 05/2017 [x ] op reports -reviewed by TJS  Multiple large fibroids noted at anatomy US:  1)slightly Rt of cervix=7.9cm 2)Rt fundal=4.8cm 3)posterior=.8cm 4)Rt anterior=3.6cm 5)mid anterior=4.1cm  Delivery planning: C-section TJS +BB assist [ ]  37 weeks- 01 Apr 2019; will request cell saver given the risk of significant PPH  5. History of shoulder dystocia with macrosomic infant with G1  Baby weighted 10lb 13oz  Patient reports did not take long to resolve  SVD 7lb 6oz G2 6. Fertility treatment Conceived this pregnancy with Letrozole and timed intercourse after Ovidrel 1.   Maternal Medical History:  No past medical history on file.  Past Surgical History:  Procedure Laterality Date  . MYOMECTOMY      No Known Allergies  Prior to Admission medications   Medication Sig Start Date End Date Taking? Authorizing Provider  ibuprofen (ADVIL,MOTRIN) 600 MG tablet Take 1 tablet every 6 hours with food for 7 days then as needed. 05/10/17   [provider]  meloxicam (MOBIC) 15 MG tablet Take 1 tablet (15 mg total) by mouth daily. 12/20/17   Hyatt, Max T, DPM  triamterene-hydrochlorothiazide (DYAZIDE) 37.5-25 MG capsule Take 1 capsule by mouth daily. 12/06/16   [provider]  valACYclovir (VALTREX) 1000 MG  tablet TAKE 1TAB 2 TIMES DAILY X 10 DAYS FOR PRIMARY OUTBREAK,THEN TWICE DAILY X5 DAYS FOR FUTURE OUTBREAKS 12/03/17   [provider]     Prenatal care site: [place]    Social History: She  reports that she has never smoked. She has never used smokeless tobacco. She reports that she does not drink alcohol or use drugs.  Family History: family history is not on file.   Review of Systems: A full review of systems was performed and negative except as noted in the HPI.     Physical Exam:  Vital Signs: There were no vitals taken for this visit. General: no acute distress.  HEENT: normocephalic, atraumatic Heart: regular rate & rhythm.  No murmurs/rubs/gallops Lungs: clear to auscultation bilaterally, normal respiratory effort Abdomen: soft, gravid, non-tender;  EFW:  Pelvic:     Extremities: non-tender, symmetric,  edema bilaterally.  DTRs:   Neurologic: Alert & oriented x 3.    No results found for this or any previous visit (from the past 24 hour(s)).  Pertinent Results:  Prenatal Labs: Blood type/Rh A+  Antibody screen neg  Rubella Immune  Varicella Immune  RPR NR  HBsAg Neg  HIV NR  GC neg  Chlamydia neg  Genetic screening Negative, declined  1 hour GTT   3 hour GTT   GBS    FHT: TOCO: SVE:    /   /      Cephalic by leopolds  No results found.  Assessment:  Andrea Brewer is a 33 y.o. No obstetric history on file. female at Unknown with multiple fibroids and prior history of myomectomies . High risk for bleeding at time of surgery    Plan:  1. Admit to Labor & Delivery 04/01/2019 2. Primary LTCS , cell savor requested  3.   ----- Marcello Moores SchermerhornMD Attending Obstetrician and Gynecologist [facility], Department of [facility]

## 2019-03-21 NOTE — H&P (Deleted)
  The note originally documented on this encounter has been moved the the encounter in which it belongs.  

## 2019-03-25 ENCOUNTER — Encounter
Admission: RE | Admit: 2019-03-25 | Discharge: 2019-03-25 | Disposition: A | Discharge status: Home / Self Care | Payer: BC Managed Care – PPO | Source: Ambulatory Visit | Attending: Obstetrics and Gynecology | Admitting: Obstetrics and Gynecology

## 2019-03-25 ENCOUNTER — Other Ambulatory Visit: Payer: Self-pay

## 2019-03-25 DIAGNOSIS — Z01812 Encounter for preprocedural laboratory examination: Secondary | ICD-10-CM | POA: Diagnosis present

## 2019-03-25 HISTORY — DX: Herpesviral infection of urogenital system, unspecified: A60.00

## 2019-03-25 HISTORY — DX: Hypothyroidism, unspecified: E03.9

## 2019-03-25 NOTE — Patient Instructions (Addendum)
Your procedure is scheduled on: Monday, April 01, 2019 Arrive at the [facility] entrance of the hospital at 5:30 am.  Call [phone] when you arrive at the entrance and someone from Labor and Delivery will assist you with entering the building and up to the [employer].  REMEMBER: Instructions that are not followed completely may result in serious medical risk, up to and including death; or upon the discretion of your surgeon and anesthesiologist your surgery may need to be rescheduled.  Do not eat food after midnight the night before surgery.  No gum chewing, lozengers or hard candies.  You may however, drink CLEAR liquids up to 2 hours before you are scheduled to arrive for your surgery. Do not drink anything within 2 hours of the start of your surgery.  Clear liquids include: - water  - apple juice without pulp - gatorade - black coffee or tea (Do NOT add milk or creamers to the coffee or tea) Do NOT drink anything that is not on this list.  No Alcohol for 24 hours before or after surgery.  No Smoking including e-cigarettes for 24 hours prior to surgery.  No chewable tobacco products for at least 6 hours prior to surgery.  No nicotine patches on the day of surgery.  On the morning of surgery brush your teeth with toothpaste and water, you may rinse your mouth with mouthwash if you wish. Do not swallow any toothpaste or mouthwash.  Notify your doctor if there is any change in your medical condition (cold, fever, infection).  Do not wear jewelry, make-up, hairpins, clips or nail polish.  Do not wear lotions, powders, or perfumes.   Do not shave 48 hours prior to surgery.   Contacts and dentures may not be worn into surgery.  Do not bring valuables to the hospital, including drivers license, insurance or credit cards.  [facility] is not responsible for any belongings or valuables.   TAKE THESE MEDICATIONS THE MORNING OF SURGERY:  1.  Levothyroxine  Use CHG wipes  as directed on instruction sheet.  NOW!  Stop Anti-inflammatories (NSAIDS) such as Advil, Aleve, Ibuprofen, Motrin, Naproxen, Naprosyn and Aspirin based products such as Excedrin, Goodys Powder, BC Powder. (May take Tylenol or Acetaminophen if needed.)  NOW!  Stop ANY OVER THE COUNTER supplements until after surgery. (May continue multivitamin.)  Please call [phone] if you have any questions about these instructions.

## 2019-03-28 ENCOUNTER — Other Ambulatory Visit: Payer: BC Managed Care – PPO

## 2019-03-29 ENCOUNTER — Other Ambulatory Visit
Admission: RE | Admit: 2019-03-29 | Discharge: 2019-03-29 | Disposition: A | Discharge status: Home / Self Care | Payer: BC Managed Care – PPO | Source: Ambulatory Visit | Attending: Obstetrics and Gynecology | Admitting: Obstetrics and Gynecology

## 2019-03-29 ENCOUNTER — Other Ambulatory Visit: Payer: Self-pay

## 2019-03-29 DIAGNOSIS — Z20828 Contact with and (suspected) exposure to other viral communicable diseases: Secondary | ICD-10-CM | POA: Diagnosis not present

## 2019-03-29 DIAGNOSIS — Z01812 Encounter for preprocedural laboratory examination: Secondary | ICD-10-CM | POA: Insufficient documentation

## 2019-03-29 LAB — SARS CORONAVIRUS 2 (TAT 6-24 HRS): SARS Coronavirus 2: NEGATIVE

## 2019-04-01 ENCOUNTER — Inpatient Hospital Stay: Payer: BC Managed Care – PPO | Admitting: Anesthesiology

## 2019-04-01 ENCOUNTER — Encounter
Admission: RE | Disposition: A | Discharge status: Home / Self Care | Payer: Self-pay | Source: Home / Self Care | Attending: Obstetrics and Gynecology

## 2019-04-01 ENCOUNTER — Other Ambulatory Visit: Payer: Self-pay

## 2019-04-01 ENCOUNTER — Inpatient Hospital Stay
Admission: RE | Admit: 2019-04-01 | Payer: BC Managed Care – PPO | Source: Home / Self Care | Admitting: Obstetrics and Gynecology

## 2019-04-01 ENCOUNTER — Inpatient Hospital Stay
Admission: RE | Admit: 2019-04-01 | Discharge: 2019-04-03 | DRG: 787 | Disposition: A | Discharge status: Home / Self Care | Payer: BC Managed Care – PPO | Attending: Obstetrics and Gynecology | Admitting: Obstetrics and Gynecology

## 2019-04-01 DIAGNOSIS — A6000 Herpesviral infection of urogenital system, unspecified: Secondary | ICD-10-CM | POA: Diagnosis present

## 2019-04-01 DIAGNOSIS — E039 Hypothyroidism, unspecified: Secondary | ICD-10-CM | POA: Diagnosis present

## 2019-04-01 DIAGNOSIS — O9081 Anemia of the puerperium: Secondary | ICD-10-CM | POA: Diagnosis not present

## 2019-04-01 DIAGNOSIS — Z3A37 37 weeks gestation of pregnancy: Secondary | ICD-10-CM | POA: Diagnosis not present

## 2019-04-01 DIAGNOSIS — D259 Leiomyoma of uterus, unspecified: Secondary | ICD-10-CM | POA: Diagnosis present

## 2019-04-01 DIAGNOSIS — O3413 Maternal care for benign tumor of corpus uteri, third trimester: Secondary | ICD-10-CM | POA: Diagnosis present

## 2019-04-01 DIAGNOSIS — O26893 Other specified pregnancy related conditions, third trimester: Secondary | ICD-10-CM | POA: Diagnosis present

## 2019-04-01 DIAGNOSIS — O9832 Other infections with a predominantly sexual mode of transmission complicating childbirth: Secondary | ICD-10-CM | POA: Diagnosis present

## 2019-04-01 DIAGNOSIS — O99284 Endocrine, nutritional and metabolic diseases complicating childbirth: Secondary | ICD-10-CM | POA: Diagnosis present

## 2019-04-01 DIAGNOSIS — D62 Acute posthemorrhagic anemia: Secondary | ICD-10-CM | POA: Diagnosis not present

## 2019-04-01 DIAGNOSIS — Z9889 Other specified postprocedural states: Secondary | ICD-10-CM

## 2019-04-01 LAB — BASIC METABOLIC PANEL
Anion gap: 8 (ref 5–15)
BUN: <5 mg/dL — ABNORMAL LOW (ref 6–20)
CO2: 19 mmol/L — ABNORMAL LOW (ref 22–32)
Calcium: 8.2 mg/dL — ABNORMAL LOW (ref 8.9–10.3)
Chloride: 110 mmol/L (ref 98–111)
Creatinine, Ser: 0.42 mg/dL — ABNORMAL LOW (ref 0.44–1.00)
GFR calc Af Amer: >60 mL/min (ref >60)
GFR calc non Af Amer: >60 mL/min (ref >60)
Glucose, Bld: 96 mg/dL (ref 70–99)
Potassium: 3.4 mmol/L — ABNORMAL LOW (ref 3.5–5.1)
Sodium: 137 mmol/L (ref 135–145)

## 2019-04-01 LAB — CREATININE, SERUM
Creatinine, Ser: 0.35 mg/dL — ABNORMAL LOW (ref 0.44–1.00)
GFR calc Af Amer: >60 mL/min (ref >60)
GFR calc non Af Amer: >60 mL/min (ref >60)

## 2019-04-01 LAB — CBC
HCT: 33.7 % — ABNORMAL LOW (ref 36.0–46.0)
HCT: 27.4 % — ABNORMAL LOW (ref 36.0–46.0)
Hemoglobin: 10.8 g/dL — ABNORMAL LOW (ref 12.0–15.0)
Hemoglobin: 8.6 g/dL — ABNORMAL LOW (ref 12.0–15.0)
MCH: 27.4 pg (ref 26.0–34.0)
MCH: 27.3 pg (ref 26.0–34.0)
MCHC: 32 g/dL (ref 30.0–36.0)
MCHC: 31.4 g/dL (ref 30.0–36.0)
MCV: 85.5 fL (ref 80.0–100.0)
MCV: 87 fL (ref 80.0–100.0)
Platelets: 205 10*3/uL (ref 150–400)
Platelets: 203 10*3/uL (ref 150–400)
RBC: 3.94 MIL/uL (ref 3.87–5.11)
RBC: 3.15 MIL/uL — ABNORMAL LOW (ref 3.87–5.11)
RDW: 14.6 % (ref 11.5–15.5)
RDW: 14.9 % (ref 11.5–15.5)
WBC: 11.9 10*3/uL — ABNORMAL HIGH (ref 4.0–10.5)
WBC: 17.9 10*3/uL — ABNORMAL HIGH (ref 4.0–10.5)
nRBC: 0 % (ref 0.0–0.2)
nRBC: 0 % (ref 0.0–0.2)

## 2019-04-01 LAB — ABO/RH: ABO/RH(D): A POS

## 2019-04-01 LAB — TYPE AND SCREEN
ABO/RH(D): A POS
Antibody Screen: NEGATIVE

## 2019-04-01 SURGERY — Surgical Case
Anesthesia: Spinal

## 2019-04-01 MED ORDER — OXYCODONE HCL 5 MG PO TABS: 10.0000 mg | ORAL_TABLET | ORAL | Status: DC | PRN

## 2019-04-01 MED ORDER — ONDANSETRON HCL 4 MG/2ML IJ SOLN
INTRAMUSCULAR | Status: DC | PRN
  Administered 2019-04-01: 4 mg via INTRAVENOUS

## 2019-04-01 MED ORDER — ONDANSETRON HCL 4 MG/2ML IJ SOLN: 4.0000 mg | Freq: Three times a day (TID) | INTRAMUSCULAR | Status: DC | PRN

## 2019-04-01 MED ORDER — BUPIVACAINE LIPOSOME 1.3 % IJ SUSP
INTRAMUSCULAR | Status: AC
  Filled 2019-04-01: qty 20

## 2019-04-01 MED ORDER — PHENYLEPHRINE HCL (PRESSORS) 10 MG/ML IV SOLN
INTRAVENOUS | Status: DC | PRN
  Administered 2019-04-01: 50 ug via INTRAVENOUS
  Administered 2019-04-01: 100 ug via INTRAVENOUS
  Administered 2019-04-01: 50 ug via INTRAVENOUS
  Administered 2019-04-01 (×2): 100 ug via INTRAVENOUS

## 2019-04-01 MED ORDER — MORPHINE SULFATE (PF) 0.5 MG/ML IJ SOLN
INTRAMUSCULAR | Status: DC | PRN
  Administered 2019-04-01: .1 mg via INTRAVENOUS

## 2019-04-01 MED ORDER — GABAPENTIN 300 MG PO CAPS
300.0000 mg | ORAL_CAPSULE | Freq: Every day | ORAL | Status: DC
  Administered 2019-04-01 – 2019-04-02 (×2): 300 mg via ORAL
  Filled 2019-04-01 (×2): qty 1

## 2019-04-01 MED ORDER — BUPIVACAINE HCL (PF) 0.5 % IJ SOLN
INTRAMUSCULAR | Status: AC
  Filled 2019-04-01: qty 30

## 2019-04-01 MED ORDER — MEPERIDINE HCL 25 MG/ML IJ SOLN: 6.2500 mg | INTRAMUSCULAR | Status: DC | PRN

## 2019-04-01 MED ORDER — ENOXAPARIN SODIUM 40 MG/0.4ML ~~LOC~~ SOLN
40.0000 mg | SUBCUTANEOUS | Status: DC
  Administered 2019-04-02 – 2019-04-03 (×2): 40 mg via SUBCUTANEOUS
  Filled 2019-04-01 (×2): qty 0.4

## 2019-04-01 MED ORDER — SIMETHICONE 80 MG PO CHEW: 80.0000 mg | CHEWABLE_TABLET | ORAL | Status: DC | PRN

## 2019-04-01 MED ORDER — LACTATED RINGERS IV SOLN: INTRAVENOUS | Status: DC

## 2019-04-01 MED ORDER — MENTHOL 3 MG MT LOZG
1.0000 | LOZENGE | OROMUCOSAL | Status: DC | PRN
  Filled 2019-04-01: qty 9

## 2019-04-01 MED ORDER — NALBUPHINE HCL 10 MG/ML IJ SOLN
5.0000 mg | INTRAMUSCULAR | Status: DC | PRN
  Administered 2019-04-01: 5 mg via INTRAVENOUS
  Filled 2019-04-01: qty 1

## 2019-04-01 MED ORDER — TETANUS-DIPHTH-ACELL PERTUSSIS 5-2.5-18.5 LF-MCG/0.5 IM SUSP: 0.5000 mL | Freq: Once | INTRAMUSCULAR | Status: DC

## 2019-04-01 MED ORDER — DIBUCAINE (PERIANAL) 1 % EX OINT: 1.0000 "application " | TOPICAL_OINTMENT | CUTANEOUS | Status: DC | PRN

## 2019-04-01 MED ORDER — MISOPROSTOL 200 MCG PO TABS
ORAL_TABLET | ORAL | Status: AC
  Filled 2019-04-01: qty 5

## 2019-04-01 MED ORDER — BUPIVACAINE LIPOSOME 1.3 % IJ SUSP
20.0000 mL | Freq: Once | INTRAMUSCULAR | Status: DC
  Filled 2019-04-01: qty 20

## 2019-04-01 MED ORDER — PRENATAL MULTIVITAMIN CH
1.0000 | ORAL_TABLET | Freq: Every day | ORAL | Status: DC
  Administered 2019-04-01 – 2019-04-03 (×3): 1 via ORAL
  Filled 2019-04-01 (×3): qty 1

## 2019-04-01 MED ORDER — COCONUT OIL OIL
1.0000 "application " | TOPICAL_OIL | Status: DC | PRN
  Filled 2019-04-01: qty 120

## 2019-04-01 MED ORDER — NALBUPHINE HCL 10 MG/ML IJ SOLN: 5.0000 mg | INTRAMUSCULAR | Status: DC | PRN

## 2019-04-01 MED ORDER — NALBUPHINE HCL 10 MG/ML IJ SOLN: 5.0000 mg | Freq: Once | INTRAMUSCULAR | Status: DC | PRN

## 2019-04-01 MED ORDER — CARBOPROST TROMETHAMINE 250 MCG/ML IM SOLN
INTRAMUSCULAR | Status: AC
  Filled 2019-04-01: qty 1

## 2019-04-01 MED ORDER — SIMETHICONE 80 MG PO CHEW
80.0000 mg | CHEWABLE_TABLET | Freq: Three times a day (TID) | ORAL | Status: DC
  Administered 2019-04-01 – 2019-04-03 (×7): 80 mg via ORAL
  Filled 2019-04-01 (×7): qty 1

## 2019-04-01 MED ORDER — OXYCODONE HCL 5 MG PO TABS: 5.0000 mg | ORAL_TABLET | ORAL | Status: DC | PRN

## 2019-04-01 MED ORDER — DIPHENHYDRAMINE HCL 25 MG PO CAPS: 25.0000 mg | ORAL_CAPSULE | Freq: Four times a day (QID) | ORAL | Status: DC | PRN

## 2019-04-01 MED ORDER — ZOLPIDEM TARTRATE 5 MG PO TABS: 5.0000 mg | ORAL_TABLET | Freq: Every evening | ORAL | Status: DC | PRN

## 2019-04-01 MED ORDER — ACETAMINOPHEN 325 MG PO TABS: 650.0000 mg | ORAL_TABLET | Freq: Four times a day (QID) | ORAL | Status: DC

## 2019-04-01 MED ORDER — BUPIVACAINE LIPOSOME 1.3 % IJ SUSP: 20.0000 mL | Freq: Once | INTRAMUSCULAR | Status: DC

## 2019-04-01 MED ORDER — FENTANYL CITRATE (PF) 100 MCG/2ML IJ SOLN
INTRAMUSCULAR | Status: DC | PRN
  Administered 2019-04-01: 15 ug via INTRATHECAL

## 2019-04-01 MED ORDER — IBUPROFEN 800 MG PO TABS
800.0000 mg | ORAL_TABLET | Freq: Three times a day (TID) | ORAL | Status: DC
  Administered 2019-04-01 – 2019-04-03 (×7): 800 mg via ORAL
  Filled 2019-04-01 (×7): qty 1

## 2019-04-01 MED ORDER — OXYCODONE HCL 5 MG PO TABS
5.0000 mg | ORAL_TABLET | ORAL | Status: DC | PRN
  Administered 2019-04-02 – 2019-04-03 (×4): 5 mg via ORAL
  Administered 2019-04-03: 16:00:00 10 mg via ORAL
  Filled 2019-04-01 (×6): qty 1

## 2019-04-01 MED ORDER — SIMETHICONE 80 MG PO CHEW
80.0000 mg | CHEWABLE_TABLET | ORAL | Status: DC
  Administered 2019-04-01 – 2019-04-02 (×2): 80 mg via ORAL
  Filled 2019-04-01 (×2): qty 1

## 2019-04-01 MED ORDER — SENNOSIDES-DOCUSATE SODIUM 8.6-50 MG PO TABS
2.0000 | ORAL_TABLET | ORAL | Status: DC
  Administered 2019-04-01 – 2019-04-02 (×2): 2 via ORAL
  Filled 2019-04-01 (×2): qty 2

## 2019-04-01 MED ORDER — CEFAZOLIN SODIUM-DEXTROSE 2-4 GM/100ML-% IV SOLN
2.0000 g | INTRAVENOUS | Status: AC
  Administered 2019-04-01: 2 g via INTRAVENOUS
  Filled 2019-04-01: qty 100

## 2019-04-01 MED ORDER — SODIUM CHLORIDE (PF) 0.9 % IJ SOLN
INTRAMUSCULAR | Status: AC
  Filled 2019-04-01: qty 50

## 2019-04-01 MED ORDER — NALOXONE HCL 0.4 MG/ML IJ SOLN: 0.4000 mg | INTRAMUSCULAR | Status: DC | PRN

## 2019-04-01 MED ORDER — MORPHINE SULFATE (PF) 2 MG/ML IV SOLN: 1.0000 mg | INTRAVENOUS | Status: DC | PRN

## 2019-04-01 MED ORDER — OXYTOCIN 40 UNITS IN NORMAL SALINE INFUSION - SIMPLE MED
INTRAVENOUS | Status: AC
  Filled 2019-04-01: qty 1000

## 2019-04-01 MED ORDER — ACETAMINOPHEN 500 MG PO TABS
1000.0000 mg | ORAL_TABLET | Freq: Four times a day (QID) | ORAL | Status: DC
  Administered 2019-04-01 – 2019-04-03 (×9): 1000 mg via ORAL
  Filled 2019-04-01 (×9): qty 2

## 2019-04-01 MED ORDER — SODIUM CHLORIDE 0.9% FLUSH: 3.0000 mL | INTRAVENOUS | Status: DC | PRN

## 2019-04-01 MED ORDER — SODIUM CHLORIDE 0.9 % IV SOLN
INTRAVENOUS | Status: DC | PRN
  Administered 2019-04-01: 50 ug/min via INTRAVENOUS

## 2019-04-01 MED ORDER — WITCH HAZEL-GLYCERIN EX PADS: 1.0000 "application " | MEDICATED_PAD | CUTANEOUS | Status: DC | PRN

## 2019-04-01 MED ORDER — KETOROLAC TROMETHAMINE 30 MG/ML IJ SOLN
30.0000 mg | Freq: Four times a day (QID) | INTRAMUSCULAR | Status: DC
  Administered 2019-04-01: 30 mg via INTRAVENOUS
  Filled 2019-04-01: qty 1

## 2019-04-01 MED ORDER — ACETAMINOPHEN 500 MG PO TABS
1000.0000 mg | ORAL_TABLET | Freq: Once | ORAL | Status: AC
  Administered 2019-04-01: 08:00:00 1000 mg via ORAL
  Filled 2019-04-01: qty 2

## 2019-04-01 MED ORDER — SOD CITRATE-CITRIC ACID 500-334 MG/5ML PO SOLN
30.0000 mL | ORAL | Status: AC
  Administered 2019-04-01: 30 mL via ORAL
  Filled 2019-04-01: qty 30

## 2019-04-01 MED ORDER — KETOROLAC TROMETHAMINE 30 MG/ML IJ SOLN: 30.0000 mg | Freq: Four times a day (QID) | INTRAMUSCULAR | Status: DC

## 2019-04-01 MED ORDER — NALOXONE HCL 4 MG/10ML IJ SOLN
1.0000 ug/kg/h | INTRAVENOUS | Status: DC | PRN
  Filled 2019-04-01: qty 5

## 2019-04-01 MED ORDER — HEPARIN SODIUM (PORCINE) 1000 UNIT/ML IJ SOLN
INTRAMUSCULAR | Status: AC
  Filled 2019-04-01: qty 3

## 2019-04-01 MED ORDER — DIPHENHYDRAMINE HCL 25 MG PO CAPS: 25.0000 mg | ORAL_CAPSULE | ORAL | Status: DC | PRN

## 2019-04-01 MED ORDER — GABAPENTIN 300 MG PO CAPS
300.0000 mg | ORAL_CAPSULE | Freq: Once | ORAL | Status: AC
  Administered 2019-04-01: 08:00:00 300 mg via ORAL
  Filled 2019-04-01: qty 1

## 2019-04-01 MED ORDER — METHYLERGONOVINE MALEATE 0.2 MG/ML IJ SOLN
INTRAMUSCULAR | Status: AC
  Filled 2019-04-01: qty 1

## 2019-04-01 MED ORDER — EPHEDRINE SULFATE-NACL 50-0.9 MG/10ML-% IV SOSY
PREFILLED_SYRINGE | INTRAVENOUS | Status: DC | PRN
  Administered 2019-04-01: 10 mg via INTRAVENOUS
  Administered 2019-04-01: 5 mg via INTRAVENOUS

## 2019-04-01 MED ORDER — BUPIVACAINE HCL (PF) 0.5 % IJ SOLN
INTRAMUSCULAR | Status: DC | PRN
  Administered 2019-04-01: 25 mL

## 2019-04-01 MED ORDER — SODIUM CHLORIDE 0.9 % IV SOLN
INTRAVENOUS | Status: DC | PRN
  Administered 2019-04-01: 09:00:00 65 mL

## 2019-04-01 MED ORDER — DIPHENHYDRAMINE HCL 50 MG/ML IJ SOLN: 12.5000 mg | INTRAMUSCULAR | Status: DC | PRN

## 2019-04-01 MED ORDER — BUPIVACAINE IN DEXTROSE 0.75-8.25 % IT SOLN
INTRATHECAL | Status: DC | PRN
  Administered 2019-04-01: 1.7 mL via INTRATHECAL

## 2019-04-01 MED ORDER — OXYTOCIN 40 UNITS IN NORMAL SALINE INFUSION - SIMPLE MED
2.5000 [IU]/h | INTRAVENOUS | Status: AC
  Administered 2019-04-01: 15:00:00 2.5 [IU]/h via INTRAVENOUS
  Filled 2019-04-01: qty 1000

## 2019-04-01 MED ORDER — OXYTOCIN 40 UNITS IN NORMAL SALINE INFUSION - SIMPLE MED
INTRAVENOUS | Status: DC | PRN
  Administered 2019-04-01: 500 mL via INTRAVENOUS

## 2019-04-01 MED ORDER — LACTATED RINGERS IV SOLN
INTRAVENOUS | Status: DC
  Administered 2019-04-01: 07:00:00 via INTRAVENOUS

## 2019-04-01 SURGICAL SUPPLY — 37 items
BARRIER ADHS 3X4 INTERCEED (GAUZE/BANDAGES/DRESSINGS) ×2 IMPLANT
CANISTER SUCT 3000ML PPV (MISCELLANEOUS) ×2 IMPLANT
CELL SAVER AAL HAEMONETICS (MISCELLANEOUS) ×2
CELL SAVER COLL SVCS (MISCELLANEOUS) ×2
CELL SAVER LEUKOCYTE  FILTER P (MISCELLANEOUS) ×1
CELL SAVER LEUKOCYTE FILTER P (MISCELLANEOUS) ×1
CHLORAPREP W/TINT 26 (MISCELLANEOUS) ×2 IMPLANT
COVER WAND RF STERILE (DRAPES) ×2 IMPLANT
DRSG TELFA 3X8 NADH (GAUZE/BANDAGES/DRESSINGS) ×2 IMPLANT
ELECT CAUTERY BLADE 6.4 (BLADE) ×2 IMPLANT
ELECT REM PT RETURN 9FT ADLT (ELECTROSURGICAL) ×2
ELECTRODE REM PT RTRN 9FT ADLT (ELECTROSURGICAL) ×1 IMPLANT
FILTER LEUKOCYTE P CELL SAVER (MISCELLANEOUS) ×1 IMPLANT
GAUZE SPONGE 4X4 12PLY STRL (GAUZE/BANDAGES/DRESSINGS) ×2 IMPLANT
GLOVE BIO SURGEON STRL SZ8 (GLOVE) ×2 IMPLANT
GOWN STRL REUS W/ TWL LRG LVL3 (GOWN DISPOSABLE) ×2 IMPLANT
GOWN STRL REUS W/ TWL XL LVL3 (GOWN DISPOSABLE) ×1 IMPLANT
GOWN STRL REUS W/TWL LRG LVL3 (GOWN DISPOSABLE) ×2
GOWN STRL REUS W/TWL XL LVL3 (GOWN DISPOSABLE) ×1
NS IRRIG 1000ML POUR BTL (IV SOLUTION) ×2 IMPLANT
PACK C SECTION AR (MISCELLANEOUS) ×2 IMPLANT
PAD OB MATERNITY 4.3X12.25 (PERSONAL CARE ITEMS) ×2 IMPLANT
PAD PREP 24X41 OB/GYN DISP (PERSONAL CARE ITEMS) ×2 IMPLANT
PENCIL SMOKE ULTRAEVAC 22 CON (MISCELLANEOUS) ×2 IMPLANT
RETRACTOR TRAXI PANNICULUS (MISCELLANEOUS) IMPLANT
SAVER CELL COLL SVCS (MISCELLANEOUS) ×1 IMPLANT
SAVER CELL HAEMONETICS (MISCELLANEOUS) ×1 IMPLANT
SPONGE LAP 18X18 RF (DISPOSABLE) ×6 IMPLANT
STAPLER INSORB 30 2030 C-SECTI (MISCELLANEOUS) ×2 IMPLANT
STRAP SAFETY 5IN WIDE (MISCELLANEOUS) ×2 IMPLANT
SUCT VACUUM KIWI BELL (SUCTIONS) ×2 IMPLANT
SUT CHROMIC 1 CTX 36 (SUTURE) ×6 IMPLANT
SUT PLAIN GUT 0 (SUTURE) ×4 IMPLANT
SUT VIC AB 0 CT1 36 (SUTURE) ×4 IMPLANT
SUT VIC AB 2-0 CT1 27 (SUTURE) ×1
SUT VIC AB 2-0 CT1 TAPERPNT 27 (SUTURE) ×1 IMPLANT
TRAXI PANNICULUS RETRACTOR (MISCELLANEOUS)

## 2019-04-01 NOTE — Anesthesia Preprocedure Evaluation (Signed)
Anesthesia Evaluation  Patient identified by MRN, date of birth, ID band Patient awake    Reviewed: Allergy & Precautions, NPO status , Patient's Chart, lab work & pertinent test results  History of Anesthesia Complications Negative for: history of anesthetic complications  Airway Mallampati: II  TM Distance: >3 FB Neck ROM: Full    Dental no notable dental hx.    Pulmonary neg pulmonary ROS, neg sleep apnea, neg COPD,    breath sounds clear to auscultation- rhonchi (-) wheezing      Cardiovascular Exercise Tolerance: Good (-) hypertension(-) CAD, (-) Past MI, (-) Cardiac Stents and (-) CABG  Rhythm:Regular Rate:Normal - Systolic murmurs and - Diastolic murmurs    Neuro/Psych neg Seizures negative neurological ROS  negative psych ROS   GI/Hepatic negative GI ROS, Neg liver ROS,   Endo/Other  neg diabetesHypothyroidism   Renal/GU negative Renal ROS     Musculoskeletal negative musculoskeletal ROS (+)   Abdominal (+) + obese,   Peds  Hematology negative hematology ROS (+)   Anesthesia Other Findings Past Medical History: No date: Genital HSV No date: Hypothyroidism during pregnancy, unspecified trimester   Reproductive/Obstetrics (+) Pregnancy                             Lab Results  Component Value Date   WBC 11.9 (H) 04/01/2019   HGB 10.8 (L) 04/01/2019   HCT 33.7 (L) 04/01/2019   MCV 85.5 04/01/2019   PLT 205 04/01/2019    Anesthesia Physical Anesthesia Plan  ASA: II  Anesthesia Plan: Spinal   Post-op Pain Management:    Induction:   PONV Risk Score and Plan: 2 and Ondansetron  Airway Management Planned: Natural Airway  Additional Equipment:   Intra-op Plan:   Post-operative Plan:   Informed Consent: I have reviewed the patients History and Physical, chart, labs and discussed the procedure including the risks, benefits and alternatives for the proposed anesthesia  with the patient or authorized representative who has indicated his/her understanding and acceptance.     Dental advisory given  Plan Discussed with: CRNA and Anesthesiologist  Anesthesia Plan Comments:         Anesthesia Quick Evaluation

## 2019-04-01 NOTE — Discharge Summary (Signed)
Obstetrical Discharge Summary  Patient Name: Andrea Brewer DOB: [birth year] MRN: [id number]  Date of Admission: 04/01/2019 Date of Delivery: 01 Apr 2019 Delivered by: Huel Cote MD Date of Discharge: 04/03/2019 Primary OB: [facility]  [id number] last menstrual period was 07/16/2018. EDC Estimated Date of Delivery: 04/22/19 Gestational Age at Delivery: [redacted]w[redacted]d   Antepartum complications: fibroid uterus Admitting Diagnosis: prior myomectomy , large fibroids  Secondary Diagnosis: Patient Active Problem List   Diagnosis Date Noted  . Delivery by elective cesarean section 04/01/2019  . PPH (postpartum hemorrhage) 04/01/2019  . Post-operative state 04/01/2019  . Obesity, unspecified 02/13/2017  . Pelvic mass 02/13/2017  . Dizziness of unknown cause 02/17/2015  . Adnexal cyst 01/29/2015  . PCOS (polycystic ovarian syndrome) 07/15/2014  . 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 Fibroids 11/26/2012    Augmentation: n/a Complications: [id number] Intrapartum complications/course: 0000000 cc ebl with cells saver returning 500 cc packed rbc to pt .  Date of Delivery: 04/03/2019  Delivered By: Huel Cote MD Delivery Type: primary cesarean section, low transverse incision Anesthesia: spinal Placenta: spontaneous Laceration: n/a Episiotomy: none Newborn Data: Female   3700 gm , apgars 6/7/7     Postpartum Procedures: none  Post partum course: Patient had an uncomplicated postpartum course. She did have asymptomatic postop acute blood loss anemia.  By time of discharge on POD#2, her pain was controlled on oral pain medications; she had appropriate lochia and was ambulating, voiding without difficulty, tolerating regular diet and passing flatus.   She was deemed stable for discharge to home.    Discharge Physical Exam:  BP (!) 129/91 (BP Location: Right Arm)   Pulse (!) 105 Comment: pt  pumping  Temp 98.3 F (36.8 C) (Oral)   Resp 18   Ht 5\' 6"  (1.676 m)   Wt 117.9 kg   LMP 07/16/2018   SpO2 98%   BMI 41.97 kg/m   General: NAD CV: RRR Pulm: CTABL, nl effort ABD: s/nd/nt, fundus firm and below the umbilicus Lochia: moderate Incision: c/d/i DVT Evaluation: LE non-ttp, no evidence of DVT on exam.  Hemoglobin  Date Value Ref Range Status  04/03/2019 7.7 (L) 12.0 - 15.0 g/dL Final   HCT  Date Value Ref Range Status  04/03/2019 23.8 (L) 36.0 - 46.0 % Final     Disposition: stable, discharge to home. Baby Feeding: formula Baby Disposition: home with mom  Rh Immune globulin given: n/a Rubella vaccine given: n/a Tdap vaccine given in AP or PP setting: AP   Contraception: none  Prenatal Labs:  Blood type/Rh A+  Antibody screen neg  Rubella Immune  Varicella Immune  RPR NR  HBsAg Neg  HIV NR  GC neg  Chlamydia neg  Genetic screening declined  1 hour GTT 163  3 hour GTT WNL  GBS negative      Plan:  KIRALYN STUMBO was discharged to home in good condition. Follow-up appointment with delivering provider in 6 weeks.  Discharge Medications: Allergies as of 04/03/2019   No Known Allergies     Medication List    TAKE these medications   acetaminophen 500 MG tablet Commonly known as: TYLENOL Take 2 tablets (1,000 mg total) by mouth every 6 (six) hours.   calcium carbonate 500 MG chewable tablet Commonly known as: TUMS - dosed in mg elemental calcium Chew 1 tablet by mouth as needed for indigestion or heartburn.   ferrous sulfate 325 (65 FE) MG  tablet Take 1 tablet (325 mg total) by mouth 2 (two) times daily with a meal. Start taking on: April 04, 2019   ibuprofen 800 MG tablet Commonly known as: ADVIL Take 1 tablet (800 mg total) by mouth every 6 (six) hours.   levothyroxine 50 MCG tablet Commonly known as: SYNTHROID Take 50 mcg by mouth daily before breakfast.   oxyCODONE 5 MG immediate release tablet Commonly known as:  Oxy IR/ROXICODONE Take 1-2 tablets (5-10 mg total) by mouth every 4 (four) hours as needed for moderate pain.   PRENATAL PO Take 1 tablet by mouth daily.   senna-docusate 8.6-50 MG tablet Commonly known as: Senokot-S Take 2 tablets by mouth 2 (two) times daily.   valACYclovir 1000 MG tablet Commonly known as: VALTREX Take 1,000 mg by mouth as needed.            Discharge Care Instructions  (From admission, onward)         Start     Ordered   04/03/19 0000  Discharge wound care:    Comments: Keep incision dry, clean.   04/03/19 1644          Follow-up Information    Schermerhorn, Gwen Her, MD. Go on 04/10/2019.   Specialty: Obstetrics and Gynecology Why: Postpartum follow up appointment on Wedneday October 21st at 11:15 AM with Dr. Ouida Sills for an incision check Contact information: [address] [place] [address] [phone]           Signed: ----- Larey Days, MD, [place] Attending Obstetrician and Gynecologist [facility], Department of [facility]

## 2019-04-01 NOTE — Anesthesia Post-op Follow-up Note (Signed)
Anesthesia QCDR form completed.        

## 2019-04-01 NOTE — Lactation Note (Signed)
This note was copied from a baby's chart. Lactation Consultation Note  Patient Name: Andrea Brewer [id number] Date: 04/01/2019 Reason for consult: Follow-up assessment;Early term 37-38.6wks;Other (Comment)(Low blood glucose)  Assisted mom with breast feeds.  After first breast feed in birthplace, blood glucose increased from 37 to 62.  Can easily hand express colostrum.  [name] breast feeds well with strong rhythmic sucking and occasional swallows,  Feeding cues explained and encouraged to put [name] to the breast whenever she demonstrates hunger cues.  Before this breast feed, the blood glucose was 33 and after breast feed was only 37.  Hand expressed and spoon fed 2 1/2 spoonfuls of colostrum.  Mom declines formula or donor breast milk.  Neonatologist discussed with parents need to start IV if glucose does not increase before next feeding.  Reviewed normal newborn stomach size, supply and demand, normal course of lactation and routine newborn feeding patterns.  Lactation name and number written on white board and encouraged to call with any questions, concerns or assistance. Maternal Data Formula Feeding for Exclusion: No Has patient been taught Hand Expression?: Yes Does the patient have breastfeeding experience prior to this delivery?: Yes  Feeding Feeding Type: Breast Milk  LATCH Score Latch: Repeated attempts needed to sustain latch, nipple held in mouth throughout feeding, stimulation needed to elicit sucking reflex.  Audible Swallowing: A few with stimulation  Type of Nipple: Everted at rest and after stimulation  Comfort (Breast/Nipple): Soft / non-tender  Hold (Positioning): No assistance needed to correctly position infant at breast.  LATCH Score: 8  Interventions Interventions: Breast massage;Breast compression  Lactation Tools Discussed/Used WIC Program: No(BCBS insurance)   Consult Status Consult Status: Follow-up Follow-up type: Call as needed(Check on next  blood glucose)    Jarold Motto 04/01/2019, 9:59 PM

## 2019-04-01 NOTE — Transfer of Care (Signed)
Immediate Anesthesia Transfer of Care Note  Patient: Andrea Brewer  Procedure(s) Performed: CESAREAN SECTION (N/A )  Patient Location: L&D  Anesthesia Type:Spinal  Level of Consciousness: awake, alert  and oriented  Airway & Oxygen Therapy: Patient Spontanous Breathing  Post-op Assessment: Report given to RN and Post -op Vital signs reviewed and stable  Post vital signs: Reviewed and stable  Last Vitals:  Vitals Value Taken Time  BP 100/53 04/01/19 0906  Temp    Pulse 95 04/01/19 0906  Resp 17 04/01/19 0906  SpO2 100 % 04/01/19 0906    Last Pain:  Vitals:   04/01/19 0615  TempSrc: Oral  PainSc: 0-No pain         Complications: No apparent anesthesia complications

## 2019-04-01 NOTE — Op Note (Signed)
NAME: Andrea Brewer, Andrea Brewer MEDICAL RECORD [id number] ACCOUNT [id number] DATE OF BIRTH:1985/12/18 FACILITY: [facility] LOCATION: [facility] PHYSICIAN:Gerrad Welker Josefine Class, MD  OPERATIVE REPORT  DATE OF PROCEDURE:  04/01/2019  PREOPERATIVE DIAGNOSES: 1.  37+0 weeks estimated gestational age. 2.  History of prior myomectomies. 3.  Fibroid uterus.  POSTOPERATIVE DIAGNOSES: 1.  Postpartum hemorrhage. 2.  A vigorous female delivered. 3.  Fibroid uterus.  PROCEDURE: 1.  Low transverse cesarean section. 2.  Blood transfusion via Cell Saver.  ANESTHESIA:  Spinal.  SURGEON:  Laverta Baltimore, MD  FIRST ASSISTANT:  Benjaman Kindler, MD  INDICATION:  A 33 year old gravida 3, para 2 patient with 2 prior myomectomies and is noted to be at risk for postpartum hemorrhage.  The patient had several fibroids that have been documented throughout pregnancy.  DESCRIPTION OF PROCEDURE:  After adequate spinal anesthesia, the patient was placed in dorsal supine position, hip roll on the right side.  The patient's abdomen was prepped and draped in sterile fashion.  The patient did receive 2 g IV Ancef prior to  commencement of the case.  Cell Saver representative was in the room, and the Cell Saver was set up prior to commencement of the case.  Timeout was performed.  A Pfannenstiel incision was made 2 fingerbreadths above the symphysis pubis.  Sharp dissection  was used to identify the fascia.  Fascia was opened in the midline and opened in a transverse fashion.  Superior aspect of the fascia was grasped with Kocher clamps, and the recti muscles were dissected free.  Inferior aspect of the fascia was grasped  with Kocher clamps, and pyramidalis muscle was dissected free.  Entry into the peritoneal cavity was accomplished sharply.  There were no significant adhesions noted.  The vesicouterine peritoneal fold was identified, and a bladder flap was created and  the bladder was reflected inferiorly.  Low  transverse uterine incision was made.  The placenta was encountered going through the anterior wall.  Once gaining entrance into the endometrial cavity, clear fluid resulted.  Fetal head was then brought to the  incision.  A Kiwi vacuum was attached to the occiput, and with 1 gentle pull, the fetal head was delivered and the vacuum was removed.  Shoulders and body were then delivered without difficulty.  A vigorous female was then placed on the mother's abdomen  and delayed cord clamping for 60 seconds.  A vigorous female was passed to nursery staff who assigned Apgar scores of 6, 7, and 7.  Weight 3700 g.  The placenta was manually delivered, and given the size of the uterus, still approximately 20 weeks in  size.  The uterus was kept in situ for the repair.  The endometrial cavity was wiped clean with laparotomy tape, and the uterine incision was then closed with 1 chromic suture in a running locking fashion.  Good approximation of edges.  Good hemostasis  was noted.  The patient's abdomen was irrigated, and the pericolic gutters were wiped clean with laparotomy tape.  Interceed was placed over the uterine incision in a T-shaped fashion.  Fascia was then closed with 0 Vicryl suture in a running nonlocking  fashion.  Good approximation of edges.  The fascial edges were then injected with a solution of 20 mL of 1.3% Exparel plus 30 mL of 0.5% Marcaine and 50 mL normal saline.  Approximately 60 mL was injected at the fascial edges.  Subcutaneous tissues were  irrigated and bovied for hemostasis, and the skin was reapproximated with Insorb absorbable staples.  An additional 30 mL of Exparel solution was injected beneath the skin.  COMPLICATIONS:  There were no complications.  ESTIMATED BLOOD LOSS:  2200 ml  URINE OUTPUT:  200 mL.  The patient did receive 500 mL of packed blood cells that were recovered through the Cell Saver and delivered back to the patient.  INTRAOPERATIVE FLUIDS:  1 L.  DISPOSITION:   The patient tolerated the procedure well and was taken to recovery room in good condition.  LN/NUANCE  D:04/01/2019 T:04/01/2019 JOB:[id number]

## 2019-04-01 NOTE — Progress Notes (Signed)
Patient ID: Andrea Brewer, female   DOB: [birth year], 33 y.o.   MRN: [id number] Labs reviewed . All questions answered . Proceed with LTCS

## 2019-04-01 NOTE — Brief Op Note (Signed)
04/01/2019  8:58 AM  PATIENT:  Andrea Brewer  33 y.o. female  PRE-OPERATIVE DIAGNOSIS:  MULTIPLE MYOMECTOMIES Fibroid uterus , 37 +[redacted] weeks ega  POST-OPERATIVE DIAGNOSIS:  Post partum hemorrhage  Viable female  PROCEDURE:  Procedure(s): CESAREAN SECTION (N/A)  SURGEON:  Surgeon(s) and Role:    * Katiejo Gilroy, Gwen Her, MD - Primary    * Benjaman Kindler, MD - Assisting  PHYSICIAN ASSISTANT:   ASSISTANTS: none   ANESTHESIA:   spinal  EBL:  2200 cc -    BLOOD ADMINISTERED:500 cc delivered back with cell savor use  CC PRBC  DRAINS: Urinary Catheter (Foley)   LOCAL MEDICATIONS USED:  MARCAINE    and BUPIVICAINE   SPECIMEN:  No Specimen  DISPOSITION OF SPECIMEN:  N/A  COUNTS:  YES  TOURNIQUET:  * No tourniquets in log *  DICTATION: .Other Dictation: Dictation Number verbal  PLAN OF CARE: Admit to inpatient   PATIENT DISPOSITION:  PACU - hemodynamically stable.   Delay start of Pharmacological VTE agent (>24hrs) due to surgical blood loss or risk of bleeding: not applicable

## 2019-04-01 NOTE — Anesthesia Procedure Notes (Signed)
Spinal  Patient location during procedure: OR Start time: 04/01/2019 7:42 AM End time: 04/01/2019 7:48 AM Staffing Anesthesiologist: Emmie Niemann, MD Resident/CRNA: Aline Brochure, CRNA Performed: resident/CRNA  Preanesthetic Checklist Completed: patient identified, site marked, surgical consent, pre-op evaluation, timeout performed, IV checked, risks and benefits discussed and monitors and equipment checked Spinal Block Patient position: sitting Prep: ChloraPrep Patient monitoring: heart rate, continuous pulse ox and blood pressure Approach: midline Location: L3-4 Injection technique: single-shot Needle Needle type: Pencil-Tip and Introducer  Needle gauge: 24 G Needle length: 9 cm Assessment Sensory level: T4 Additional Notes Negative paresthesia. Negative blood return. Positive free-flowing CSF. Expiration date of kit checked and confirmed. Patient tolerated procedure well, without complications.

## 2019-04-01 NOTE — Progress Notes (Signed)
Pt. arrived to L/D PACU  From OR  with her own blood, washed RBCs, from cell-save. Per Dr. Jerene Canny blood could be run in pt at 120 ml/hr. Pt. received 250 mL of blood. Verified by ANMD.  Vitals stable during blood administration and after.  0.9% NS running at 50 ml/hr.  Pt. Stable.

## 2019-04-02 ENCOUNTER — Encounter: Payer: Self-pay | Admitting: Obstetrics and Gynecology

## 2019-04-02 LAB — CBC
HCT: 25.2 % — ABNORMAL LOW (ref 36.0–46.0)
Hemoglobin: 8.1 g/dL — ABNORMAL LOW (ref 12.0–15.0)
MCH: 28 pg (ref 26.0–34.0)
MCHC: 32.1 g/dL (ref 30.0–36.0)
MCV: 87.2 fL (ref 80.0–100.0)
Platelets: 200 10*3/uL (ref 150–400)
RBC: 2.89 MIL/uL — ABNORMAL LOW (ref 3.87–5.11)
RDW: 15.1 % (ref 11.5–15.5)
WBC: 15.4 10*3/uL — ABNORMAL HIGH (ref 4.0–10.5)
nRBC: 0 % (ref 0.0–0.2)

## 2019-04-02 LAB — BASIC METABOLIC PANEL
Anion gap: 9 (ref 5–15)
BUN: 5 mg/dL — ABNORMAL LOW (ref 6–20)
CO2: 21 mmol/L — ABNORMAL LOW (ref 22–32)
Calcium: 7.8 mg/dL — ABNORMAL LOW (ref 8.9–10.3)
Chloride: 106 mmol/L (ref 98–111)
Creatinine, Ser: 0.4 mg/dL — ABNORMAL LOW (ref 0.44–1.00)
GFR calc Af Amer: >60 mL/min (ref >60)
GFR calc non Af Amer: >60 mL/min (ref >60)
Glucose, Bld: 109 mg/dL — ABNORMAL HIGH (ref 70–99)
Potassium: 3.6 mmol/L (ref 3.5–5.1)
Sodium: 136 mmol/L (ref 135–145)

## 2019-04-02 MED ORDER — BREAST MILK/FORMULA (FOR LABEL PRINTING ONLY): ORAL | Status: DC

## 2019-04-02 MED ORDER — FERROUS SULFATE 325 (65 FE) MG PO TABS
325.0000 mg | ORAL_TABLET | Freq: Two times a day (BID) | ORAL | Status: DC
  Administered 2019-04-02 – 2019-04-03 (×4): 325 mg via ORAL
  Filled 2019-04-02 (×4): qty 1

## 2019-04-02 NOTE — Anesthesia Post-op Follow-up Note (Signed)
  Anesthesia Pain Follow-up Note  Patient: Andrea Brewer  Day #: 1  Date of Follow-up: 04/02/2019 Time: 8:06 AM  Last Vitals:  Vitals:   04/02/19 0300 04/02/19 0350  BP:  131/81  Pulse: 97 80  Resp:  20  Temp:  36.8 C  SpO2: 98%     Level of Consciousness: alert  Pain: none   Side Effects:None  Catheter Site Exam:clean  Anti-Coag Meds (From admission, onward)   Start     Dose/Rate Route Frequency Ordered Stop   04/02/19 0800  enoxaparin (LOVENOX) injection 40 mg     40 mg Subcutaneous Every 24 hours 04/01/19 1319         Plan: D/C from anesthesia care at surgeon's request  [facility]

## 2019-04-02 NOTE — Anesthesia Postprocedure Evaluation (Cosign Needed)
Anesthesia Post Note  Patient: Andrea Brewer  Procedure(s) Performed: CESAREAN SECTION (N/A )  Patient location during evaluation: Nursing Unit Anesthesia Type: Spinal Level of consciousness: awake, awake and alert and oriented Pain management: pain level controlled Vital Signs Assessment: post-procedure vital signs reviewed and stable Respiratory status: spontaneous breathing, nonlabored ventilation and respiratory function stable Cardiovascular status: blood pressure returned to baseline and stable Postop Assessment: no headache and no backache Anesthetic complications: no     Last Vitals:  Vitals:   04/02/19 0300 04/02/19 0350  BP:  131/81  Pulse: 97 80  Resp:  20  Temp:  36.8 C  SpO2: 98%     Last Pain:  Vitals:   04/02/19 0350  TempSrc: Axillary  PainSc:                  Andrea Brewer

## 2019-04-02 NOTE — Progress Notes (Signed)
Post Op Day 1  Subjective: Doing well, no concerns. Ambulating without difficulty, pain managed with PO meds, tolerating regular diet, and voiding without difficulty.   No fever/chills, chest pain, shortness of breath, nausea/vomiting, or leg pain. No nipple or breast pain.   Objective: BP 131/81 (BP Location: Right Arm)   Pulse 80   Temp 98.2 F (36.8 C) (Axillary)   Resp 20   Ht 5\' 6"  (1.676 m)   Wt 117.9 kg   LMP 07/16/2018   SpO2 98%   BMI 41.97 kg/m    Physical Exam:  General: alert, cooperative, appears stated age and fatigued Breasts: soft/nontender CV: RRR Pulm: nl effort, CTABL Abdomen: soft, non-tender, active bowel sounds Uterine Fundus: firm Incision: healing well, no significant drainage, no dehiscence, no significant erythema Lochia: appropriate DVT Evaluation: No evidence of DVT seen on physical exam. No cords or calf tenderness. No significant calf/ankle edema.  Recent Labs    04/01/19 0601 04/01/19 1326  HGB 10.8* 8.6*  HCT 33.7* 27.4*  WBC 11.9* 17.9*  PLT 205 203    Assessment/Plan: 33 y.o. G3P2002 postop day # 1  -Continue routine postpartum care -Lactation consult PRN for breastfeeding -Discussed contraceptive, patient plans none.   -Acute blood loss anemia - hemodynamically stable and asymptomatic; start PO ferrous sulfate BID with stool softeners  -Immunization status: all immunizations up to date -Baby in special care for hypoglycemia and feeding difficulties.   Disposition: Continue inpatient postpartum care   LOS: 1 day   Lisette Grinder, CNM 04/02/2019, 8:11 AM   ----- Lisette Grinder Certified Nurse Midwife [facility]

## 2019-04-02 NOTE — Progress Notes (Signed)
RN checked pre-feed glucose (33).1 hour post feed glucose (37). RN reviewed results with  and reviewed options with parents. NNP also saw parents at this point. Parents were strongly encouraged to supplement. Pre-feed glucose at 2308 (49) Pre-feed check at 0148 (36). RN strongly encouraged parents to supplement with donor milk or formula, Neo was paged. Parents wanted to continue trying to breast feed only. Mom breast fed about 5 minutes. Parents agreed to supplement at this point. RN attempted to bottle/syringe feed, but infant spit up the majority of the 4 mLs she did take. NNP saw parents and reviewed their options.

## 2019-04-03 LAB — CBC
HCT: 23.8 % — ABNORMAL LOW (ref 36.0–46.0)
Hemoglobin: 7.7 g/dL — ABNORMAL LOW (ref 12.0–15.0)
MCH: 28.2 pg (ref 26.0–34.0)
MCHC: 32.4 g/dL (ref 30.0–36.0)
MCV: 87.2 fL (ref 80.0–100.0)
Platelets: 211 10*3/uL (ref 150–400)
RBC: 2.73 MIL/uL — ABNORMAL LOW (ref 3.87–5.11)
RDW: 15.8 % — ABNORMAL HIGH (ref 11.5–15.5)
WBC: 16.5 10*3/uL — ABNORMAL HIGH (ref 4.0–10.5)
nRBC: 0 % (ref 0.0–0.2)

## 2019-04-03 MED ORDER — IBUPROFEN 800 MG PO TABS: 800.0000 mg | ORAL_TABLET | Freq: Four times a day (QID) | ORAL | 2 refills | Status: DC

## 2019-04-03 MED ORDER — SENNOSIDES-DOCUSATE SODIUM 8.6-50 MG PO TABS: 2.0000 | ORAL_TABLET | Freq: Two times a day (BID) | ORAL | 3 refills | Status: DC

## 2019-04-03 MED ORDER — ACETAMINOPHEN 500 MG PO TABS: 1000.0000 mg | ORAL_TABLET | Freq: Four times a day (QID) | ORAL | 0 refills | Status: DC

## 2019-04-03 MED ORDER — FERROUS SULFATE 325 (65 FE) MG PO TABS: 325.0000 mg | ORAL_TABLET | Freq: Two times a day (BID) | ORAL | 3 refills | Status: DC

## 2019-04-03 MED ORDER — OXYCODONE HCL 5 MG PO TABS: 5.0000 mg | ORAL_TABLET | ORAL | 0 refills | Status: DC | PRN

## 2019-04-03 NOTE — Progress Notes (Signed)
Discharge order received from doctor. Reviewed discharge instructions and prescriptions with patient and answered all questions. Incision cleaning kit given and reviewed. Follow up appointment given. Patient verbalized understanding. ID bands checked. Patient discharged home with infant (infant discharged from [facility] by NICU nurse) via wheelchair by nursing/auxillary.    Hilbert Bible, RN

## 2019-04-03 NOTE — Lactation Note (Signed)
This note was copied from a baby's chart. Lactation Consultation Note  Patient Name: Andrea Brewer [id number] Date: 04/03/2019 Reason for consult: Follow-up assessment  LC checked in with mom and [name]. Mom reports baby has been breastfeeding well overnight and this morning while rooming in. Mom feels her milk "is in", with larger volumes satisfying baby longer between feeds. Feeding plan is to continue to feed at the breast on cue, no need for ongoing pumping or feeding via bottle.  Provided guidance on fullness and engorgement management. Reviewed importance of baby fully emptying breasts, signs of hunger vs fullness, wet/stool diapers, and behaviors post feeds. Reviewed newborn stomach size, feeding patterns, growth spurts/cluster feeding, milk supply and demand. Information given for outpatient lactation consult services, and virtual breastfeeding support groups through [facility].  Maternal Data Formula Feeding for Exclusion: No Has patient been taught Hand Expression?: Yes Does the patient have breastfeeding experience prior to this delivery?: Yes  Feeding    LATCH Score                   Interventions Interventions: Comfort gels;Breast feeding basics reviewed  Lactation Tools Discussed/Used     Consult Status Consult Status: Complete Date: 04/03/19 Follow-up type: Call as needed    Andrea Brewer 04/03/2019, 12:15 PM

## 2019-04-03 NOTE — Discharge Instructions (Signed)
Please call your doctor or return to the ER if you experience any chest pains, shortness of breath, dizziness, visual changes, severe headache (unrelieved by pain meds), fever greater than 101, any heavy bleeding (saturating more than 1 pad per hour), large clots, or foul smelling discharge, any worsening abdominal pain and cramping that is not controlled by pain medication, any calf/leg pain or redness, any breast concerns (redness/pain), or any signs of postpartum depression. No tampons, enemas, douches, or sexual intercourse for 6 weeks. Also avoid tub baths, hot tubs, or swimming for 6 weeks.   Check your incision daily for any signs of infection such as redness, warmth, swelling, increased pain, or pus/foul smelling drainage   Activity: do not lift over 10lbs for 6 weeks  No driving for 1-2 weeks Pelvic rest for 6 weeks

## 2020-02-11 ENCOUNTER — Ambulatory Visit: Payer: BC Managed Care – PPO | Admitting: Podiatry

## 2020-03-11 ENCOUNTER — Ambulatory Visit: Payer: Self-pay | Admitting: Podiatry

## 2020-03-30 ENCOUNTER — Encounter: Payer: Self-pay | Admitting: Podiatry

## 2020-03-30 ENCOUNTER — Other Ambulatory Visit: Payer: Self-pay

## 2020-03-30 ENCOUNTER — Ambulatory Visit (INDEPENDENT_AMBULATORY_CARE_PROVIDER_SITE_OTHER): Payer: BC Managed Care – PPO | Admitting: Podiatry

## 2020-03-30 DIAGNOSIS — L600 Ingrowing nail: Secondary | ICD-10-CM | POA: Diagnosis not present

## 2020-03-30 DIAGNOSIS — L603 Nail dystrophy: Secondary | ICD-10-CM | POA: Diagnosis not present

## 2020-03-30 MED ORDER — NEOMYCIN-POLYMYXIN-HC 1 % OT SOLN: OTIC | 1 refills | Status: AC

## 2020-03-30 NOTE — Progress Notes (Signed)
She presents today chief complaint of ingrown toenail lateral border right foot.  Objective: Vital signs are stable alert oriented x3.  Pulses are palpable.  Sharp incurvated nail margin along the fibular border the hallux l right quizzically tender on palpation.  Assessment: Pain in limb secondary to onychomycosis.  Plan: Chemical matricectomy tibial border hallux right.  She tolerated procedure well without complications provided both oral and written home-going instruction for the care and soaking of the toe as well as a prescription for Corticosporin otic to be applied twice daily after soaking.  I will follow-up with her in a few weeks.  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.  Her husband is a Company secretary

## 2020-03-30 NOTE — Patient Instructions (Signed)

## 2020-04-08 ENCOUNTER — Encounter: Payer: Self-pay | Admitting: *Deleted

## 2020-04-16 ENCOUNTER — Encounter: Payer: Self-pay | Admitting: *Deleted

## 2020-04-21 ENCOUNTER — Telehealth: Payer: Self-pay

## 2020-04-21 NOTE — Telephone Encounter (Signed)
-----   Message from Garrel Ridgel, [place] sent at 04/13/2020  6:56 AM EDT ----- Negative for fungus.  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

## 2020-04-21 NOTE — Telephone Encounter (Signed)
Patient notified of results.

## 2020-04-22 ENCOUNTER — Ambulatory Visit (INDEPENDENT_AMBULATORY_CARE_PROVIDER_SITE_OTHER): Payer: BC Managed Care – PPO | Admitting: Podiatry

## 2020-04-22 ENCOUNTER — Other Ambulatory Visit: Payer: Self-pay

## 2020-04-22 DIAGNOSIS — L03031 Cellulitis of right toe: Secondary | ICD-10-CM

## 2020-04-22 MED ORDER — CEPHALEXIN 500 MG PO CAPS: 500.0000 mg | ORAL_CAPSULE | Freq: Two times a day (BID) | ORAL | 0 refills | Status: AC

## 2020-04-22 NOTE — Progress Notes (Signed)
She presents today for follow-up of her matrixectomy fibular border of the hallux right.  She states that is doing just great until she went to [employer] and walked too much she states that it got red and inflamed and was draining a bit so she used some Neosporin and states that it seems to be doing better.  Objective: Vital signs are stable alert oriented x3.  Mild erythema along the fibular border of the hallux right.  There is no purulence no malodor.  Mildly tender on palpation.  Assessment: Mild paronychia status post matrixectomy fibular border hallux right.  Plan: At this point and let me go ahead and start her on antibiotics.  Cephalexin 500 mg 1 p.o. twice daily x10 days.  She will continue to soak Epson salt and warm water twice daily.  She cover with a Band-Aid and leave open at night apply a small amount of antibiotic ointment.  I will follow-up with her in 2 weeks for reevaluation.  If improved she can cancel the appointment.

## 2020-04-23 NOTE — Addendum Note (Signed)
Addended by: Clovis Riley E on: 04/23/2020 08:40 AM   Modules accepted: Level of Service

## 2020-05-05 ENCOUNTER — Other Ambulatory Visit: Payer: Self-pay | Admitting: Surgery

## 2020-05-05 ENCOUNTER — Other Ambulatory Visit (HOSPITAL_COMMUNITY): Payer: Self-pay | Admitting: Surgery

## 2020-05-05 DIAGNOSIS — M7521 Bicipital tendinitis, right shoulder: Secondary | ICD-10-CM

## 2020-05-05 DIAGNOSIS — M7581 Other shoulder lesions, right shoulder: Secondary | ICD-10-CM

## 2020-05-20 ENCOUNTER — Other Ambulatory Visit: Payer: Self-pay

## 2020-05-20 ENCOUNTER — Ambulatory Visit
Admission: RE | Admit: 2020-05-20 | Discharge: 2020-05-20 | Disposition: A | Discharge status: Home / Self Care | Payer: BC Managed Care – PPO | Source: Ambulatory Visit | Attending: Surgery | Admitting: Surgery

## 2020-05-20 DIAGNOSIS — M7521 Bicipital tendinitis, right shoulder: Secondary | ICD-10-CM | POA: Diagnosis present

## 2020-05-20 DIAGNOSIS — M7581 Other shoulder lesions, right shoulder: Secondary | ICD-10-CM | POA: Insufficient documentation

## 2024-02-28 ENCOUNTER — Ambulatory Visit

## 2024-02-28 DIAGNOSIS — D492 Neoplasm of unspecified behavior of bone, soft tissue, and skin: Secondary | ICD-10-CM

## 2024-02-28 DIAGNOSIS — L578 Other skin changes due to chronic exposure to nonionizing radiation: Secondary | ICD-10-CM

## 2024-02-28 DIAGNOSIS — D219 Benign neoplasm of connective and other soft tissue, unspecified: Secondary | ICD-10-CM

## 2024-02-28 DIAGNOSIS — L821 Other seborrheic keratosis: Secondary | ICD-10-CM | POA: Diagnosis not present

## 2024-02-28 DIAGNOSIS — D485 Neoplasm of uncertain behavior of skin: Secondary | ICD-10-CM | POA: Diagnosis not present

## 2024-02-28 DIAGNOSIS — L814 Other melanin hyperpigmentation: Secondary | ICD-10-CM

## 2024-02-28 DIAGNOSIS — Z1283 Encounter for screening for malignant neoplasm of skin: Secondary | ICD-10-CM | POA: Diagnosis not present

## 2024-02-28 DIAGNOSIS — W908XXA Exposure to other nonionizing radiation, initial encounter: Secondary | ICD-10-CM

## 2024-02-28 DIAGNOSIS — D229 Melanocytic nevi, unspecified: Secondary | ICD-10-CM

## 2024-02-28 DIAGNOSIS — D1801 Hemangioma of skin and subcutaneous tissue: Secondary | ICD-10-CM

## 2024-02-28 NOTE — Patient Instructions (Addendum)

## 2024-02-28 NOTE — Progress Notes (Signed)
 Subjective   Andrea Brewer is a 38 y.o. female who presents for the following: Lesion(s) of concern . Patient is new patient  Today patient reports: Lesion on posterior right thigh present for about a year, patient states it is painful if she hits it, has not bled. Patient thought when it first came up was a wart. No personal hx of skin cancer.   Review of Systems:    No other skin or systemic complaints except as noted in HPI or Assessment and Plan.  The following portions of the chart were reviewed this encounter and updated as appropriate: medications, allergies, medical history  Relevant Medical History:  Family history of skin cancer - Father   Objective  Well appearing patient in no apparent distress; mood and affect are within normal limits. Examination was performed of the: Sun Exposed Exam: Scalp, head, eyes, ears, nose, lips, neck, upper extremities, hands, fingers, fingernails  Examination notable for: SKIN EXAM, Angioma(s): Scattered red vascular papule(s)  , Lentigo/lentigines: Scattered pigmented macules that are tan to brown in color and are somewhat non-uniform in shape and concentrated in the sun-exposed areas, Nevus/nevi: Scattered well-demarcated, regular, pigmented macule(s) and/or papule(s)  , Seborrheic Keratosis(es): Stuck-on appearing keratotic papule(s) on the trunk, none  irritated with redness, crusting, edema, and/or partial avulsion, Actinic Damage/Elastosis: chronic sun damage: dyspigmentation, telangiectasia, and wrinkling  Examination limited by: Clothing and Patient deferred removal of the above    Right posterior thigh 6 mm pink blue subcutaneous nodule   Assessment & Plan   SKIN CANCER SCREENING PERFORMED TODAY.  BENIGN SKIN FINDINGS  - Lentigines  - Seborrheic keratoses  - Hemangiomas   - Nevus/Multiple Benign Nevi  - Reassurance provided regarding the benign appearance of lesions noted on exam today; no treatment is indicated in the  absence of symptoms/changes. - Reinforced importance of photoprotective strategies including liberal and frequent sunscreen use of a broad-spectrum SPF 30 or greater, use of protective clothing, and sun avoidance for prevention of cutaneous malignancy and photoaging.  Counseled patient on the importance of regular self-skin monitoring as well as routine clinical skin examinations as scheduled.   ACTINIC DAMAGE - Chronic condition, secondary to cumulative UV/sun exposure - Recommend daily broad spectrum sunscreen SPF 30+ to sun-exposed areas, reapply every 2 hours as needed.  - Staying in the shade or wearing long sleeves, sun glasses (UVA+UVB protection) and wide brim hats (4-inch brim around the entire circumference of the hat) are also recommended for sun protection.  - Call for new or changing lesions.  Procedures, orders, diagnosis for this visit:  NEOPLASM OF SKIN Right posterior thigh Skin / nail biopsy Type of biopsy: punch   Informed consent: discussed and consent obtained   Timeout: patient name, date of birth, surgical site, and procedure verified   Procedure prep:  Patient was prepped and draped in usual sterile fashion Prep type:  Isopropyl alcohol Anesthesia: the lesion was anesthetized in a standard fashion   Anesthetic:  1% lidocaine w/ epinephrine 1-100,000 buffered w/ 8.4% NaHCO3 Punch size:  4 mm Suture size:  4-0 Suture type: Prolene (polypropylene)   Suture removal (days):  7 Hemostasis achieved with: suture, pressure and aluminum chloride   Outcome: patient tolerated procedure well   Post-procedure details: sterile dressing applied and wound care instructions given   Dressing type: bandage and petrolatum    Specimen 1 - Surgical pathology Differential Diagnosis: Cyst vs angiolipoma vs leiomyoma vs glomus vs other   Check Margins: No 6 mm  pink blue subcutaneous nodule  Neoplasm of skin -     Skin / nail biopsy -     Surgical pathology; Standing    Return  to clinic: Return for 7-10 days suture removal w/ nurse.  Documentation: I have reviewed the above documentation for accuracy and completeness, and I agree with the above.  Lauraine JAYSON Kanaris, MD

## 2024-02-29 ENCOUNTER — Ambulatory Visit: Payer: Self-pay

## 2024-02-29 LAB — SURGICAL PATHOLOGY

## 2024-02-29 NOTE — Telephone Encounter (Signed)
-----   Message from Lauraine JAYSON Kanaris sent at 02/29/2024  4:34 PM EDT -----    1. Skin, right posterior thigh :       LEIOMYOMA   Please notify patient with below plan: Benign, observe. Due to non cancerous/benign tumor from smooth muscle.  ----- Message ----- From: Interface, Lab In [place] Sent: 02/29/2024   3:02 PM EDT To: Lauraine JAYSON Kanaris, MD

## 2024-02-29 NOTE — Telephone Encounter (Signed)
 Patient informed of pathology results

## 2024-03-07 ENCOUNTER — Ambulatory Visit

## 2024-03-07 DIAGNOSIS — Z48817 Encounter for surgical aftercare following surgery on the skin and subcutaneous tissue: Secondary | ICD-10-CM

## 2024-03-07 NOTE — Patient Instructions (Signed)

## 2024-03-07 NOTE — Progress Notes (Signed)
   Follow-Up Visit   Subjective  Andrea Brewer is a 38 y.o. female who presents for the following: Suture removal  Pathology showed LEIOMYOMA at right posterior thigh.  The following portions of the chart were reviewed this encounter and updated as appropriate: medications, allergies, medical history  Review of Systems:  No other skin or systemic complaints except as noted in HPI or Assessment and Plan.  Objective  Well appearing patient in no apparent distress; mood and affect are within normal limits.  Areas Examined: Right posterior thigh Relevant physical exam findings are noted in the Assessment and Plan.    Assessment & Plan    Encounter for Removal of Sutures - Incision site is clean, dry and intact. - Wound cleansed, sutures removed, wound cleansed and band aid applied. - Discussed pathology results showing LEIOMYOMA. - Scars remodel for a full year. - Patient can apply over-the-counter silicone scar cream once to twice a day to help with scar remodeling if desired. - Patient advised to call with any concerns or if they notice any new or changing lesions.  No follow-ups on file.  Eliberto Sole V Santiago Stenzel

## 2024-04-10 ENCOUNTER — Other Ambulatory Visit: Payer: Self-pay | Admitting: Internal Medicine

## 2024-04-10 DIAGNOSIS — D4819 Other specified neoplasm of uncertain behavior of connective and other soft tissue: Secondary | ICD-10-CM

## 2024-04-10 DIAGNOSIS — R1084 Generalized abdominal pain: Secondary | ICD-10-CM

## 2024-04-16 ENCOUNTER — Ambulatory Visit

## 2024-04-16 ENCOUNTER — Ambulatory Visit
Admission: RE | Admit: 2024-04-16 | Discharge: 2024-04-16 | Disposition: A | Discharge status: Home / Self Care | Source: Ambulatory Visit | Attending: Internal Medicine | Admitting: Internal Medicine

## 2024-04-16 DIAGNOSIS — D259 Leiomyoma of uterus, unspecified: Secondary | ICD-10-CM | POA: Diagnosis present

## 2024-04-16 DIAGNOSIS — D4819 Other specified neoplasm of uncertain behavior of connective and other soft tissue: Secondary | ICD-10-CM | POA: Diagnosis present

## 2024-04-16 DIAGNOSIS — R1084 Generalized abdominal pain: Secondary | ICD-10-CM | POA: Insufficient documentation

## 2024-04-16 MED ORDER — IOHEXOL 300 MG/ML  SOLN
100.0000 mL | Freq: Once | INTRAMUSCULAR | Status: AC | PRN
  Administered 2024-04-16: 100 mL via INTRAVENOUS

## 2024-04-18 ENCOUNTER — Other Ambulatory Visit: Payer: Self-pay | Admitting: Internal Medicine

## 2024-04-18 DIAGNOSIS — R1900 Intra-abdominal and pelvic swelling, mass and lump, unspecified site: Secondary | ICD-10-CM

## 2024-04-21 ENCOUNTER — Ambulatory Visit
Admission: RE | Admit: 2024-04-21 | Discharge: 2024-04-21 | Disposition: A | Discharge status: Home / Self Care | Source: Ambulatory Visit | Attending: Internal Medicine | Admitting: Internal Medicine

## 2024-04-21 DIAGNOSIS — R1900 Intra-abdominal and pelvic swelling, mass and lump, unspecified site: Secondary | ICD-10-CM | POA: Insufficient documentation

## 2024-04-21 MED ORDER — GADOBUTROL 1 MMOL/ML IV SOLN
10.0000 mL | Freq: Once | INTRAVENOUS | Status: AC | PRN
  Administered 2024-04-21: 10 mL via INTRAVENOUS

## 2024-04-30 ENCOUNTER — Inpatient Hospital Stay

## 2024-04-30 ENCOUNTER — Inpatient Hospital Stay: Attending: Oncology | Admitting: Licensed Clinical Social Worker

## 2024-04-30 ENCOUNTER — Encounter: Payer: Self-pay | Admitting: Licensed Clinical Social Worker

## 2024-04-30 ENCOUNTER — Other Ambulatory Visit: Payer: Self-pay | Admitting: Licensed Clinical Social Worker

## 2024-04-30 DIAGNOSIS — Z803 Family history of malignant neoplasm of breast: Secondary | ICD-10-CM | POA: Diagnosis not present

## 2024-04-30 DIAGNOSIS — Z8481 Family history of carrier of genetic disease: Secondary | ICD-10-CM

## 2024-04-30 DIAGNOSIS — Z1379 Encounter for other screening for genetic and chromosomal anomalies: Secondary | ICD-10-CM

## 2024-04-30 NOTE — Progress Notes (Signed)
 REFERRING PROVIDER: Cleotilde Oneil FALCON, MD [address] [facility] [place],  [place] [address]  PRIMARY PROVIDER:  Cleotilde Oneil FALCON, MD  PRIMARY REASON FOR VISIT:  1. Family history of gene mutation   2. Family history of breast cancer      HISTORY OF PRESENT ILLNESS:   Andrea Brewer, a 38 y.o. female, was seen for a [facility] cancer genetics consultation at the request of Dr. Cleotilde due to a family history of Reed's syndrome (HLRCC).  Andrea Brewer presents to clinic today to discuss the possibility of a hereditary predisposition to cancer, genetic testing, and to further clarify her future cancer risks, as well as potential cancer risks for family members.   CANCER HISTORY:  Andrea Brewer is a 38 y.o. female with no personal history of cancer.    RELEVANT MEDICAL HISTORY:  Menarche was at age 62.  First live birth at age 77.  Ovaries intact: yes.  Hysterectomy: no.  Menopausal status: premenopausal.  HRT use: 0 years. Colonoscopy: n/a; not examined. Mammogram within the last year: n/a. Number of breast biopsies: 0.   Past Medical History:  Diagnosis Date   Genital HSV    Hypothyroidism during pregnancy, unspecified trimester     Past Surgical History:  Procedure Laterality Date   CESAREAN SECTION N/A 04/01/2019   Procedure: CESAREAN SECTION;  Surgeon: Schermerhorn, Debby PARAS, MD;  Location: [facility] ORS;  Service: Obstetrics;  Laterality: N/A;   MYOMECTOMY      FAMILY HISTORY:  We obtained a detailed, 4-generation family history.  Significant diagnoses are listed below: Family History  Problem Relation Age of Onset   Other Father        HLRCC (Reed's syndrome)   Other Paternal Aunt        HLRCC (Reed's syndrome)   Breast cancer Other        possible BRCA/gene+?   Breast cancer Paternal Cousin     Andrea Brewer is unaware of previous family history of genetic testing for hereditary cancer risks. There is no reported Ashkenazi Jewish ancestry.  There is no known consanguinity.  GENETIC COUNSELING ASSESSMENT: Andrea Brewer is a 38 y.o. female with a personal history of uterine and cutaneous leiomyomas and a reported family history of Reed's syndrome (HLRCC) . We, therefore, discussed and recommended the following at today's visit.   DISCUSSION: We discussed that approximately 10% of  cancer is hereditary. We discussed Reed's syndrome which is now referred to as Hereditary Leiomyomatosis and Renal Cell Cancer (HLRCC). This increases risk for uterine and cutaneous leiomyomas and kidney cancer, and possibly neuroendocrine tumors as well. This condition is caused by mutations in the FH gene.  There are other genes associated with hereditary cancer as well. Cancers and risks are gene specific. We discussed that testing is beneficial for several reasons including knowing about cancer risks, identifying potential screening and risk-reduction options that may be appropriate, and to understand if other family members could be at risk for cancer and allow them to undergo genetic testing.   We reviewed the characteristics, features and inheritance patterns of hereditary cancer syndromes. We also discussed genetic testing, including the appropriate family members to test, the process of testing, insurance coverage and turn-around-time for results. We discussed the implications of a negative, positive and/or variant of uncertain significant result. We recommended Andrea Brewer pursue genetic testing for the [facility] CancerNext-Expanded+RNA gene panel.   The CancerNext-Expanded gene panel offered by [facility] and includes sequencing, rearrangement, and RNA analysis  for the following 77 genes: AIP, ALK, APC, ATM, AXIN2, BAP1, BARD1, BMPR1A, BRCA1, BRCA2, BRIP1, CDC73, CDH1, CDK4, CDKN1B, CDKN2A, CEBPA, CHEK2, CTNNA1, DDX41, DICER1, ETV6, FH, FLCN, GATA2, LZTR1, MAX, MBD4, MEN1, MET, MLH1, MSH2, MSH3, MSH6, MUTYH, NF1, NF2, NTHL1, PALB2, PHOX2B, PMS2, POT1,  PRKAR1A, PTCH1, PTEN, RAD51C, RAD51D, RB1, RET, RPS20, RUNX1, SDHA, SDHAF2, SDHB, SDHC, SDHD, SMAD4, SMARCA4, SMARCB1, SMARCE1, STK11, SUFU, TMEM127, TP53, TSC1, TSC2, VHL, and WT1 (sequencing and deletion/duplication); EGFR, HOXB13, KIT, MITF, PDGFRA, POLD1, and POLE (sequencing only); EPCAM and GREM1 (deletion/duplication only).   Based on Andrea Brewer's family history of reported Reed's syndrome (HLRCC),  she meets medical criteria for genetic testing. Despite that she meets criteria, she may have an out of pocket cost.   PLAN: After considering the risks, benefits, and limitations, Andrea Brewer provided informed consent to pursue genetic testing and the blood sample was sent to [facility] for analysis of the CancerNext-Expanded+RNA Panel. Results should be available within approximately 2-3 weeks' time, at which point they will be disclosed by telephone to Andrea Brewer, as will any additional recommendations warranted by these results. Andrea Brewer will receive a summary of her genetic counseling visit and a copy of her results once available. This information will also be available in Epic.   Andrea Brewer questions were answered to her satisfaction today. Our contact information was provided should additional questions or concerns arise. Thank you for the referral and allowing us  to share in the care of your patient.   Dena Cary, MS, [facility] Genetic Counselor [email or web address] Phone: [phone]  I personally spent a total of 40 minutes in the care of the patient today including counseling and educating, placing orders, and documenting clinical information in the EHR.   _______________________________________________________________________ For Office Staff:  Number of people involved in session: 1 Was an Intern/ student involved with case: no

## 2024-05-01 LAB — GENETIC SCREENING ORDER

## 2024-05-23 ENCOUNTER — Encounter: Payer: Self-pay | Admitting: Licensed Clinical Social Worker

## 2024-05-23 ENCOUNTER — Telehealth: Payer: Self-pay | Admitting: Licensed Clinical Social Worker

## 2024-05-23 ENCOUNTER — Ambulatory Visit: Payer: Self-pay | Admitting: Licensed Clinical Social Worker

## 2024-05-23 DIAGNOSIS — Z1509 Genetic susceptibility to other malignant neoplasm: Secondary | ICD-10-CM | POA: Insufficient documentation

## 2024-05-23 DIAGNOSIS — Z1379 Encounter for other screening for genetic and chromosomal anomalies: Secondary | ICD-10-CM

## 2024-05-23 NOTE — Progress Notes (Signed)
 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 Seward was previously seen in the [facility] due to a family history of Reed's syndrome (HLRCC) and concerns regarding a hereditary predisposition to cancer. Please refer to our prior cancer genetics clinic note for more information regarding our discussion, assessment and recommendations, at the time. Ms. Copen recent genetic test results were disclosed to her, as were recommendations warranted by these results. These results and recommendations are discussed in more detail below.  CANCER HISTORY:  Oncology History   No history exists.    FAMILY HISTORY:  We obtained a detailed, 4-generation family history.  Significant diagnoses are listed below: Family History  Problem Relation Age of Onset   Other Father        HLRCC (Reed's syndrome)   Other Paternal Aunt        HLRCC (Reed's syndrome)   Breast cancer Other        possible BRCA/gene+?   Breast cancer Paternal Cousin     Ms. Sant is unaware of previous family history of genetic testing for hereditary cancer risks. There is no reported Ashkenazi Jewish ancestry. There is no known consanguinity.   GENETIC TEST RESULTS:  Ms. 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 The remainder of testing was normal.   The CancerNext-Expanded gene panel offered by [facility] and includes sequencing, rearrangement, and RNA analysis for the following 77 genes: AIP, ALK, APC, ATM, AXIN2, BAP1, BARD1, BMPR1A, BRCA1, BRCA2, BRIP1, CDC73, CDH1, CDK4, CDKN1B, CDKN2A, CEBPA, CHEK2, CTNNA1, DDX41, DICER1, ETV6, FH, FLCN, GATA2, LZTR1, MAX, MBD4, MEN1, MET, MLH1, MSH2, MSH3, MSH6, MUTYH, NF1, NF2, NTHL1, PALB2, PHOX2B, PMS2, POT1, PRKAR1A, PTCH1, PTEN, RAD51C, RAD51D, RB1, RET, RPS20, RUNX1, SDHA, SDHAF2, SDHB, SDHC, SDHD, SMAD4,  SMARCA4, SMARCB1, SMARCE1, STK11, SUFU, TMEM127, TP53, TSC1, TSC2, VHL, and WT1 (sequencing and deletion/duplication); EGFR, HOXB13, KIT, MITF, PDGFRA, POLD1, and POLE (sequencing only); EPCAM and GREM1 (deletion/duplication only)  The test report has been scanned into EPIC and is located under the Molecular Pathology section of the Results Review tab.  A portion of the result report is included below for reference. Genetic testing reported out on 05/22/2024.     Clinical Information: The FH gene is associated with Hereditary Leiomyomatosis and Renal Cell Cancer (HLRCC). HLRCC is a rare, adult-onset tumor-predisposition syndrome that is characterized by cutaneous leiomyomas, uterine leiomyomas, and renal tumors. Additionally, the FH gene has preliminary evidence supporting a correlation with hereditary paraganglioma-pheochromocytoma.  Cutaneous leiomyomas appear as skin-colored or light-brown papules that can develop over the trunk, extremities, and occasionally the face.  They typically present in early adulthood (mean age 26 years) and tend to increase in size and number over time. The hallmark clinical symptom of cutaneous leiomyomas is pain, reported in about 90% of patients. Pain may be caused by exposure to cold or ice, light touch, pressure, or incidental contact.    In addition, uterine leiomyomas (fibroids) are present in nearly all females with HLRCC, but they rarely become malignant. Uterine fibroids are very common in the general population, but the fibroids in HLRCC tend to be numerous and have an earlier onset than in the general population. Symptoms include irregular or heavy menstruation and pelvic pain.  Approximately 15-20% of individuals with HLRCC develop renal tumors, including type 2 papillary, tubulo-papillary, collecting-duct carcinoma, and possibly Wilms tumor. Renal tumors in HLRCC  are typically unilateral, solitary, and aggressive. Symptoms include hematuria, lower back pain,  and a palpable mass, although some have no symptoms until the disease is advanced.  Management Recommendations: There are no established surveillance guidelines for HLRCC, though the following has been proposed:  Skin Screening: Full skin examination every 1-2 years, with cutaneous leiomyomas examined by a dermatologist to evaluate for changes suggestive of leiomyosarcoma. Treatment options for cutaneous leiomyomas include, but are not limited to: Surgical excision Cryoablation and/or lasers Several medications, including calcium channel blockers, alpha blockers, nitroglycerin, antidepressants, and antiepileptic drugs, all of which have been reported to reduce pain  Uterine Screening: Baseline pelvic bimanual examination, pelvic MRI, and/or transvaginal pelvic ultrasound examination to screen for uterine fibroids is suggested, as is an annual gynecologic consultation to assess severity of uterine fibroids and to evaluate for changes suggestive of leiomyosarcoma. HLRCC-related fibroids are treated in the same manner as sporadic fibroids; however, most women with HLRCC require interventions earlier than the general population.   Kidney Cancer Screening: Baseline renal ultrasound examination and abdominal CT scan with contrast or MRI is suggested starting at age 24 to screen for renal tumors. Early detection of kidney tumors in HLRCC is important because these tumors have an aggressive disease course. Contrast-enhanced MRI with 1-3 mm slices through the kidneys is recommended every 6 to 12 months. Renal ultrasound (unless it is the only available imaging modality) is not recommended due to the low sensitivity of ultrasound in detecting small lesions. Nephron-sparing therapy is generally suggested for small renal tumors. This is especially important for patients with hereditary renal cancer that is associated with a predisposition to recurrent, multifocal lesions. Prompt surgical removal with wide  surgical margins and consideration of retroperitoneal lymphadenectomy is advised. Total nephrectomy may be recommended if there is doubt that a partial nephrectomy would be curative. Neither radiofrequency ablation nor cryotherapy for renal cancer is advised for individuals with HLRCC.  This information is based on current understanding of the gene and may change in the future.  Implications for Family Members: Variants in the HLRCC gene have autosomal dominant inheritance. This means that an individual with a pathogenic variant has a 50% chance of passing the condition on to his/her offspring. Most cases are inherited from a parent, but some cases may occur spontaneously (i.e., an individual with a pathogenic variant has parents who do not have it). Identification of a pathogenic variant allows for the recognition of at-risk relatives who can pursue testing for the familial variant.  Individuals with a pathogenic variant in the FH gene are also carriers of a condition called fumarase deficiency (also known as fumarate hydratase deficiency and fumaric aciduria). This is an autosomal recessive condition that results when an individual inherits a pathogenic FH variant from each parent. Fumarase deficiency is an inborn error of metabolism that is characterized by rapidly progressive neurologic impairment, including hypotonia, seizures, and cerebral atrophy. Most survive only for a few months after birth. For there to be a risk to offspring of fumarate hydratase, both the patient and their partner would have to have a single pathogenic variant in FH; in such a case, the risk to offspring is 25%.  Family members are encouraged to consider genetic testing for this familial pathogenic variant. Although DNA testing of late-onset hereditary cancer syndromes is generally recommended at age 27 when the individual can provide informed consent, there is a small but definite risk of renal cell carcinoma prior to this  age, suggesting that genetic screening may be considered  at an earlier age. Family members may contact our office at [phone] for more information or to schedule an appointment. Complimentary testing for the familial variant is available for 90 days. Family members who live outside of the area are encouraged to find a genetic counselor in their area by visiting: [email or web address].    Resources: FORCE (Facing Our Risk of Cancer Empowered) is a resource for those with a hereditary predisposition to develop cancer.  FORCE provides information about risk reduction, advocacy, legislation, and clinical trials.  Additionally, FORCE provides a platform for collaboration and support; which includes: peer navigation, message boards, local support groups, a toll-free helpline, research registry and recruitment, advocate training, published medical research, webinars, brochures, mastectomy photos, and more.  For more information, visit www.facingourrisk.org HLRCC [facility] hlrccinfo.org  PLAN: 1. These results will be made available to  Ms. Zalenski's referring provider, Dr. Oneil Pinal, and her dermatologist, Dr. Raymund. We will place a referral to [facility]. She would like these providers to follow her long-term for this indication and coordinate screening.  2. Ms. Kunde plans to discuss these results with her family and will reach out to us  if we can be of any assistance in coordinating genetic testing for any of her relatives. Her oldest son is scheduled to see me 12/16. She is interested in having her 34 y/o daughter tested, we will reach out to pediatric genetics to see if they will see her.   We encouraged Ms. Scantlin to remain in contact with us  on an annual basis so we can update her personal and family histories, and let her know of advances in cancer genetics that may benefit the family. Our contact number was provided. Ms. Goldberger questions  were answered to her satisfaction today, and she knows she is welcome to call anytime with additional questions.   Dena Cary, MS, [facility] Genetic Counselor [email or web address] Phone: [phone]

## 2024-05-23 NOTE — Telephone Encounter (Signed)
 I contacted Ms. Weiss to discuss her genetic testing results. 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 Detailed clinic note to follow.   The test report has been scanned into EPIC and is located under the Molecular Pathology section of the Results Review tab.  A portion of the result report is included below for reference.      Dena Cary, MS, [facility] Genetic Counselor [email or web address] Phone: [phone]

## 2024-06-03 NOTE — Progress Notes (Unsigned)
 06/05/2024 3:04 PM   Andrea Brewer [birth year] [id number]   HPI: 38 y.o. female here for initial evaluation of HLRCC  Personal history of uterine and cutaneous leiomyomas  - followed by Genetics, patient had + pathogenic fumarate hydrase gene  - s/p prior cutaneous lesion excision, uterine myomectomies in 2014 and 2018  -No prior renal issues, has never seen a urologist  MRI Abd (04/21/24) - 2 cm RUP simple renal cyst, no other concerning lesions  Baseline Cr 0.8 (June 2025), GFR 97  FHx of +HLRCC in aunt and father  -Both had cutaneous manifestations  -Aunt also had nephrectomy, presumably from disease  -Patient has 59 year old son and 39-year-old daughter-son had genetics testing yesterday   PMH: Past Medical History:  Diagnosis Date   Genital HSV    Hypothyroidism during pregnancy, unspecified trimester     Surgical History: Past Surgical History:  Procedure Laterality Date   CESAREAN SECTION N/A 04/01/2019   Procedure: CESAREAN SECTION;  Surgeon: Schermerhorn, Debby PARAS, MD;  Location: [facility] ORS;  Service: Obstetrics;  Laterality: N/A;   MYOMECTOMY      Family History: Family History  Problem Relation Age of Onset   Other Father        HLRCC (Reed's syndrome)   Other Paternal Aunt        HLRCC (Reed's syndrome)   Breast cancer Other        possible BRCA/gene+?   Breast cancer Paternal Cousin     Social History:  reports that she has never smoked. She has never used smokeless tobacco. She reports that she does not drink alcohol and does not use drugs.      Physical Exam: BP 121/82   Pulse 87   Wt 231 lb 9.6 oz (105.1 kg)   BMI 37.38 kg/m    Constitutional:  Alert and oriented, No acute distress. Cardiovascular: No clubbing, cyanosis, or edema. Respiratory: Normal respiratory effort, no increased work of breathing. GI: Nondistended Skin: No rashes, bruises or suspicious lesions. Neurologic: Grossly intact, no focal deficits, moving all 4  extremities. Psychiatric: Normal mood and affect.  Laboratory Data: Baseline Cr 0.8 (June 2025), GFR 97   Pertinent Imaging: I have personally viewed and interpreted the MRI abd (04/21/24) - no concerning renal lesions bilaterally, otherwise morphologically normal appearing kidneys  IMPRESSION: 1. There is a 1.3 x 2.0 cm simple cortical cyst in the right kidney upper pole, medially, which corresponds to the lesion described on the prior CT scan. No suspicious renal lesion seen. 2. Multiple uterine leiomyomas. 3. There are at least 2, T2 hyperintense structures in the right adnexa which are adjacent to and inseparable from the right ovary. These are incompletely characterized on this exam. Consider further evaluation with pelvic ultrasound..    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 Patient counseled that any detected renal mass warrants early surgical management without active surveillance.  - MRI abd w/ and w/o in 1 year, due Nov 2026. Follow up with me at that time   Orders: -     Urinalysis, Complete  Other specified disorders of kidney and ureter -     MR ABDOMEN W WO CONTRAST; Future  Penne Skye, MD 06/05/2024  [facility] [address], [address] [place], [place] [address] [phone]

## 2024-06-03 NOTE — Assessment & Plan Note (Addendum)
 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 Patient counseled that any detected renal mass warrants early surgical management without active surveillance.  - MRI abd w/ and w/o in 1 year, due Nov 2026. Follow up with me at that time

## 2024-06-05 ENCOUNTER — Encounter: Payer: Self-pay | Admitting: Urology

## 2024-06-05 ENCOUNTER — Ambulatory Visit: Admitting: Urology

## 2024-06-05 VITALS — BP 121/82 | HR 87 | Wt 231.6 lb

## 2024-06-05 DIAGNOSIS — N2889 Other specified disorders of kidney and ureter: Secondary | ICD-10-CM

## 2024-06-05 DIAGNOSIS — Z1509 Genetic susceptibility to other malignant neoplasm: Secondary | ICD-10-CM | POA: Diagnosis not present

## 2024-06-05 NOTE — Patient Instructions (Signed)
 Please call [phone] to schedule your imaging prior to your appointment. Please allow time for your imaging results as this can take up 2-3 weeks to review. A follow up appointment as already been scheduled for the doctor to review results with you.

## 2024-06-06 LAB — URINALYSIS, COMPLETE
Bilirubin, UA: NEGATIVE
Glucose, UA: NEGATIVE
Ketones, UA: NEGATIVE
Leukocytes,UA: NEGATIVE
Nitrite, UA: NEGATIVE
Protein,UA: NEGATIVE
RBC, UA: NEGATIVE
Specific Gravity, UA: 1.03 (ref 1.005–1.030)
Urobilinogen, Ur: 0.2 mg/dL (ref 0.2–1.0)
pH, UA: 5.5 (ref 5.0–7.5)

## 2024-06-06 LAB — MICROSCOPIC EXAMINATION: Bacteria, UA: NONE SEEN

## 2025-05-14 ENCOUNTER — Ambulatory Visit

## 2025-06-05 ENCOUNTER — Ambulatory Visit: Admitting: Urology
